# Patient Record
Sex: Female | Born: 1950
Health system: Southern US, Community
[De-identification: ages and names within clinical notes are randomized; demographics above are authoritative.]

## PROBLEM LIST (undated history)

## (undated) DIAGNOSIS — R51 Headache: Secondary | ICD-10-CM

## (undated) DIAGNOSIS — F419 Anxiety disorder, unspecified: Secondary | ICD-10-CM

## (undated) DIAGNOSIS — R0602 Shortness of breath: Secondary | ICD-10-CM

## (undated) HISTORY — PX: KNEE ARTHROPLASTY: SHX992

## (undated) HISTORY — PX: CARDIAC CATHETERIZATION: SHX172

## (undated) HISTORY — PX: JOINT REPLACEMENT: SHX530

## (undated) HISTORY — PX: ANKLE RECONSTRUCTION: SHX1151

---

## 1997-10-26 ENCOUNTER — Encounter: Admission: RE | Admit: 1997-10-26 | Discharge: 1997-10-26 | Payer: Self-pay | Admitting: *Deleted

## 1999-05-04 ENCOUNTER — Encounter: Admission: RE | Admit: 1999-05-04 | Discharge: 1999-05-04 | Payer: Self-pay | Admitting: Family Medicine

## 1999-05-04 ENCOUNTER — Encounter: Payer: Self-pay | Admitting: Family Medicine

## 1999-07-25 ENCOUNTER — Ambulatory Visit (HOSPITAL_COMMUNITY): Admission: RE | Admit: 1999-07-25 | Discharge: 1999-07-25 | Payer: Self-pay | Admitting: Family Medicine

## 1999-08-29 ENCOUNTER — Encounter: Payer: Self-pay | Admitting: Specialist

## 1999-08-29 ENCOUNTER — Encounter: Admission: RE | Admit: 1999-08-29 | Discharge: 1999-08-29 | Payer: Self-pay | Admitting: Specialist

## 1999-12-07 ENCOUNTER — Encounter: Admission: RE | Admit: 1999-12-07 | Discharge: 1999-12-07 | Payer: Self-pay | Admitting: Family Medicine

## 1999-12-08 ENCOUNTER — Encounter: Admission: RE | Admit: 1999-12-08 | Discharge: 1999-12-08 | Payer: Self-pay | Admitting: Family Medicine

## 1999-12-29 ENCOUNTER — Encounter: Admission: RE | Admit: 1999-12-29 | Discharge: 1999-12-29 | Payer: Self-pay | Admitting: Family Medicine

## 2000-12-07 ENCOUNTER — Encounter: Admission: RE | Admit: 2000-12-07 | Discharge: 2000-12-07 | Payer: Self-pay | Admitting: Family Medicine

## 2000-12-07 ENCOUNTER — Encounter: Payer: Self-pay | Admitting: Family Medicine

## 2003-05-09 ENCOUNTER — Emergency Department (HOSPITAL_COMMUNITY): Admission: EM | Admit: 2003-05-09 | Discharge: 2003-05-09 | Payer: Self-pay | Admitting: Emergency Medicine

## 2003-05-10 ENCOUNTER — Emergency Department (HOSPITAL_COMMUNITY): Admission: EM | Admit: 2003-05-10 | Discharge: 2003-05-10 | Payer: Self-pay | Admitting: Emergency Medicine

## 2003-05-11 ENCOUNTER — Emergency Department (HOSPITAL_COMMUNITY): Admission: EM | Admit: 2003-05-11 | Discharge: 2003-05-11 | Payer: Self-pay | Admitting: Emergency Medicine

## 2003-06-30 ENCOUNTER — Encounter: Admission: RE | Admit: 2003-06-30 | Discharge: 2003-06-30 | Payer: Self-pay | Admitting: Family Medicine

## 2003-11-19 ENCOUNTER — Ambulatory Visit (HOSPITAL_COMMUNITY): Admission: RE | Admit: 2003-11-19 | Discharge: 2003-11-20 | Payer: Self-pay | Admitting: Orthopaedic Surgery

## 2004-01-27 ENCOUNTER — Ambulatory Visit (HOSPITAL_COMMUNITY): Admission: RE | Admit: 2004-01-27 | Discharge: 2004-01-27 | Payer: Self-pay | Admitting: Orthopaedic Surgery

## 2004-06-07 ENCOUNTER — Inpatient Hospital Stay (HOSPITAL_COMMUNITY): Admission: RE | Admit: 2004-06-07 | Discharge: 2004-06-08 | Payer: Self-pay | Admitting: Orthopedic Surgery

## 2004-11-21 ENCOUNTER — Encounter (INDEPENDENT_AMBULATORY_CARE_PROVIDER_SITE_OTHER): Payer: Self-pay | Admitting: *Deleted

## 2004-11-21 ENCOUNTER — Ambulatory Visit (HOSPITAL_COMMUNITY): Admission: RE | Admit: 2004-11-21 | Discharge: 2004-11-21 | Payer: Self-pay | Admitting: Gastroenterology

## 2004-11-30 ENCOUNTER — Encounter: Admission: RE | Admit: 2004-11-30 | Discharge: 2004-11-30 | Payer: Self-pay | Admitting: Gastroenterology

## 2005-12-06 ENCOUNTER — Ambulatory Visit (HOSPITAL_COMMUNITY): Admission: RE | Admit: 2005-12-06 | Discharge: 2005-12-06 | Payer: Self-pay | Admitting: Gastroenterology

## 2007-02-28 ENCOUNTER — Encounter: Admission: RE | Admit: 2007-02-28 | Discharge: 2007-02-28 | Payer: Self-pay | Admitting: Family Medicine

## 2009-07-30 ENCOUNTER — Encounter: Admission: RE | Admit: 2009-07-30 | Discharge: 2009-07-30 | Payer: Self-pay | Admitting: Family Medicine

## 2010-02-01 ENCOUNTER — Encounter
Admission: RE | Admit: 2010-02-01 | Discharge: 2010-02-01 | Payer: Self-pay | Source: Home / Self Care | Attending: Cardiovascular Disease | Admitting: Cardiovascular Disease

## 2010-02-04 ENCOUNTER — Ambulatory Visit (HOSPITAL_COMMUNITY)
Admission: RE | Admit: 2010-02-04 | Discharge: 2010-02-04 | Payer: Self-pay | Source: Home / Self Care | Attending: Cardiovascular Disease | Admitting: Cardiovascular Disease

## 2010-03-10 ENCOUNTER — Other Ambulatory Visit (HOSPITAL_COMMUNITY): Payer: Self-pay | Admitting: Orthopedic Surgery

## 2010-03-10 DIAGNOSIS — M25561 Pain in right knee: Secondary | ICD-10-CM

## 2010-03-10 DIAGNOSIS — T84039A Mechanical loosening of unspecified internal prosthetic joint, initial encounter: Secondary | ICD-10-CM

## 2010-03-22 ENCOUNTER — Ambulatory Visit (HOSPITAL_COMMUNITY)
Admission: RE | Admit: 2010-03-22 | Discharge: 2010-03-22 | Disposition: A | Discharge status: Home / Self Care | Payer: Medicare Other | Source: Ambulatory Visit | Attending: Orthopedic Surgery | Admitting: Orthopedic Surgery

## 2010-03-22 ENCOUNTER — Other Ambulatory Visit (HOSPITAL_COMMUNITY): Payer: Self-pay

## 2010-03-22 DIAGNOSIS — M25561 Pain in right knee: Secondary | ICD-10-CM

## 2010-03-22 DIAGNOSIS — Z96659 Presence of unspecified artificial knee joint: Secondary | ICD-10-CM | POA: Insufficient documentation

## 2010-03-22 DIAGNOSIS — M25569 Pain in unspecified knee: Secondary | ICD-10-CM | POA: Insufficient documentation

## 2010-03-22 DIAGNOSIS — T84039A Mechanical loosening of unspecified internal prosthetic joint, initial encounter: Secondary | ICD-10-CM

## 2010-03-22 MED ORDER — TECHNETIUM TC 99M MEDRONATE IV KIT
25.0000 | PACK | Freq: Once | INTRAVENOUS | Status: AC | PRN
  Administered 2010-03-22: 25 via INTRAVENOUS
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
## 2010-06-10 NOTE — Op Note (Signed)
NAME:  Angela Burnett, Angela Burnett NO.:  [id number]   MEDICAL RECORD NO.:  [id number]          PATIENT TYPE:  INP   LOCATION:  0006                         FACILITY:  [facility]   PHYSICIAN:  Madlyn Frankel. Charlann Boxer, M.D.  DATE OF BIRTH:  08-Oct-1950   DATE OF PROCEDURE:  06/07/2004  DATE OF DISCHARGE:                                 OPERATIVE REPORT   PREOPERATIVE DIAGNOSES:  Right knee medial compartment osteoarthritis.   POSTOPERATIVE DIAGNOSES:  Right knee medial compartment osteoarthritis.   PROCEDURE:  Right unicompartmental knee replacement.   COMPONENTS USED:  Biomed Oxford unicompartmental knee with a size small  femur, size small 38 mm tibial tray and a size 4 small poly tray,  polyethylene liner.   SURGEON:  Madlyn Frankel. Charlann Boxer, M.D.   ASSISTANTDruscilla Brownie. Cherlynn June.   ANESTHESIA:  General.   ESTIMATED BLOOD LOSS:  100 mL after the tourniquet was let down at 100  minutes at 250 mmHg.   COMPLICATIONS:  None.   SPECIMENS:  None.   INDICATIONS FOR PROCEDURE:  Angela Burnett is a 60 year old female who I have  been following for some time for right knee medial compartment OA.  She ws  scheduled for the surgery a year ago but unfortunately had a spider bite  wound that took an extensive amount of time for it to resolve with  treatment. She now had recently presented with persistent right knee  discomfort following wound healing and was ready to proceed with surgery.  She does have a medical history consistent with depression and fibromyalgia  and a history of RSD. I reviewed with her the surgical options including  total versus partial knee replacement and from a recovery standpoint and  pain standpoint, I think a unicompartmental would best serve this patient.  Consent was obtained. The risks and benefits were reviewed including DVT,  bleeding, component failure and need to convert to total knee replacement in  the future. Questions were encouraged and answered and  reviewed in the note.   DESCRIPTION OF PROCEDURE:  The patient was brought to the operative theatre.  Once adequate anesthesia and preoperative antibiotics, 1 g of Ancef, were  administered, the patient was positioned supine on the operating room table,  proximal thigh tourniquet was placed. A Murray leg holder was then placed on  the right side of the bed and the leg then positioned into the leg holder  with the hip flexed at 30 degrees until allowing the knee to be free and  draped freed to flexion to at least 110 degrees. Once this was carried out,  the right lower extremity was then prepped and draped in a sterile fashion.  An approximately 3 inch incision was made on the medial parapatellar aspect  of the skin, sharp dissection was carried down the extensor mechanism.  Medial parapatellar arthrotomy was then carried out with the extension of  the incision into the VMO. Once exposure was obtained including removing  some the patellar fat pad, a portion of the medial meniscus was removed.  Landmarks were identified, osteophytes removed off the medial femoral  condyle as  well as off the intercondylar notch on the lateral aspect of the  medial femoral condyle in order to define the anatomy. The insertion of the  ACL was identified, ACL was noted to be intact. With this done, an  extramedullary guide was placed. Initial resection was made off the tibia  using the reciprocating saw along the medial aspect of the ACL insertion off  the tibial spine extended all the way posterior following the use of an  oscillating saw. Per the protocol for this system, I measured the tray to be  a size small or 38 mm and then positioned this in the knee. I was unable to  get the 4 mm feeler into the knee and for that reason, I backed the  resection guide down to the second holes removing an additional 3 mm of  bone. Following this, the 4 mm spacer was able to be placed without  difficulty. It moved with two  finger application of pressure.   Following this, attention was directed to the femur. The intramedullary  guide was positioned 1 cm above the anterior medial aspect of the  intercondylar notch and then passed down the femoral shaft. With the tibial  tray in place and the appropriate feeler guide placed, the feeler guide was  then positioned on the femur in the center of the bone and then parallel to  the intramedullary guide in all planes. All five requirements were  positioned. Once this was carried out, the femur was drilled. Drill holes  then positioned, the posterior femoral cut was made using an oscillating  saw, bone fragments removed. At this point, the remaining meniscus was  removed out of the posterior aspect of the knee under direct visualization.  Following this, we positioned the zero spigot and milled the distal femur.  Excess bone was removed. At this point, a trial reduction was carried out in  order to establish the flexion extension gaps. With the 4 mm feeler guide in  position and flexion this was stable. However, with the knee in 20 degrees  of extension, I was unable to even pass the 1 mm block and for that reason,  we passed the 4 mm spigot and then a size 4 spigot onto the femur. We milled  the proximal femur, removed excessive bone particularly in the posterior  medial and lateral aspects of the cut milling. Removed the very distal  aspect of the femoral area which was a small ridge. I then notched the  anterior aspect of the femur an additional 3-4 mm to allow for sliding  rotation of the poly.  At this point, trial reduction was carried out with a  4 mm spigot in place. The knee was very stable with equal extension and  flexion gaps. With this information, attention was directed to final  preparation of the tibia. The tibial tray was positioned with the hook posteriorly and confirmed that it was not positioned too far posterior and  had excellent position off the  medial aspect of the tibia. It was pinned in  position, a reciprocating saw was used to create the channel followed by  removal of excessive bone using the bone trial. Trial reduction again was  carried out with the keel tray, a size small femur and then the actual trial  4 poly. It tracked beautifully without impingement anteriorly. With the  handle in place, I was able to toggle approximately 2 mm in flexion  extension. The ligaments appeared stable, the knee  came to full extension.   With all of this information, final components were brought into the field.   The wound was then copiously irrigated with pulse lavage solution. Per the  [facility] protocol, the components were cemented in two stage technique, first  with the tibia taking the time to remove excessive cement once it had cured  particularly posteriorly followed by the femur each time, a trial component  was placed including the trial liner. With this, the trial 4 liner was  positioned, knee held in 45 degrees of flexion in order to apply pressure  across the joint surface. Once the final femoral component was cemented into  position, excessive cement removed, the final size 4 poly was then snapped  into position and tracked beautifully. Again the wound was copiously  irrigated with normal saline solution. At this point, a medium Hemovac drain  was placed exiting laterally. The extensor mechanism was then reapproximated  using a #1 PDS followed by two layer wound closure with 4-0 Monocryl on the  skin. The patient's wound was then cleaned, dried and dressed sterilely with  a bulky dressing. The patient was then extubated and transferred to the  recovery room in stable condition. She tolerated the procedure well with no  complications.   Will plan on Angela Burnett being able to get out of bed with therapy today to  ambulate and will plan on discharge tomorrow.      MDO/MEDQ  D:  06/07/2004  T:  06/07/2004  Job:  [id number]

## 2010-06-10 NOTE — Op Note (Signed)
NAME:  Angela Burnett, Angela Burnett            ACCOUNT NO.:  [id number]   MEDICAL RECORD NO.:  [id number]          PATIENT TYPE:  AMB   LOCATION:  ENDO                         FACILITY:  [facility]   PHYSICIAN:  Anselmo Rod, M.D.  DATE OF BIRTH:  12-11-1950   DATE OF PROCEDURE:  11/21/2004  DATE OF DISCHARGE:                                 OPERATIVE REPORT   PROCEDURE PERFORMED:  Esophagogastroduodenoscopy with antral biopsies.   ENDOSCOPIST:  Charna Elizabeth, M.D.   INSTRUMENT USED:  Olympus video panendoscope.   INDICATIONS FOR PROCEDURE:  The patient is a 60 year old white female with a  history of nausea, vomiting, with postprandial and epigastric pain, rule out  gastric outlet obstruction, etc.  The patient has also had a 19 pound weight  loss over the last couple of years.   PREPROCEDURE PREPARATION:  Informed consent was procured from the patient.  The patient was fasted for eight hours prior to the procedure.  The risks  and benefits were discussed with her in great detail.   PREPROCEDURE PHYSICAL:  The patient had stable vital signs.  Neck supple,  chest clear to auscultation.  S1, S2 regular.  Abdomen soft with normal  bowel sounds.   DESCRIPTION OF PROCEDURE:  The patient was placed in the left lateral  decubitus position and sedated with 60 mg of Demerol and 8 mg of Versed in  slow incremental doses.  Once the patient was adequately sedated and  maintained on low-flow oxygen and continuous cardiac monitoring, the Olympus  video panendoscope was advanced through the mouth piece over the tongue into  the esophagus under direct vision.  The entire esophagus appeared normal  with no evidence of ring, stricture, masses, esophagitis or Barrett's  mucosa.  The scope was then advanced to the stomach.  Retroflexion was not  possible as the patient was very combative.  A prominent fold was seen in  the antrum with diffuse gastritis and antral erosions.  Biopsies were done  times two to rule  out presence of Helicobacter pylori by pathology.  The  proximal small bowel appeared normal.  There was no outlet obstruction.  The  patient tolerated the procedure well without immediate complications.   IMPRESSION:  1.  Normal-appearing esophagus.  2.  Diffuse gastritis with antral erosions and a prominent fold in the      antrum, biopsies done times two.  3.  Normal proximal small bowel.   RECOMMENDATIONS:  1.  Await pathology results.  2.  Avoid nonsteroidals.  3.  Proton pump inhibitor of choice.  4.  Proceed with a flexible sigmoidoscopy to see if distal colon evaluation      can be done as the patient is refusing to come back for colonoscopy.      She could not tolerate her prep and therefore was advised by the      physician on call to have an esophagogastroduodenoscopy today and have      the colonoscopy at a later date.      Anselmo Rod, M.D.  Electronically Signed     JNM/MEDQ  D:  11/21/2004  T:  11/21/2004  Job:  [id number]   cc:   Teena Irani. Arlyce Dice, M.D.  Fax: [phone]

## 2010-06-10 NOTE — Op Note (Signed)
NAME:  Angela Burnett, Angela Burnett NO.:  [id number]   MEDICAL RECORD NO.:  [id number]          PATIENT TYPE:  OIB   LOCATION:  2899                         FACILITY:  [facility]   PHYSICIAN:  Sharolyn Douglas, M.D.        DATE OF BIRTH:  10-02-1950   DATE OF PROCEDURE:  01/27/2004  DATE OF DISCHARGE:                                 OPERATIVE REPORT   PREOPERATIVE DIAGNOSIS:  Malfunctioning spinal cord stimulator.   POSTOPERATIVE DIAGNOSIS:  Malfunctioning spinal cord stimulator.   OPERATION PERFORMED:  1.  Revision thoracic laminotomy, left, for removal of permanent spinal cord      stimulator lead.  2.  Removal of IPG from right buttock.   SURGEON:  Sharolyn Douglas, M.D.   ASSISTANT:  Verlin Fester, P.A.   ANESTHESIA:  General endotracheal.   COMPLICATIONS:  None.   INDICATIONS FOR PROCEDURE:  The patient is a pleasant 60 year old female,  who is status post placement of a permanent spinal cord stimulator and IPG  for reflex sympathetic dystrophy of the left lower extremity.  Unfortunately, she has not utilized the stimulator.  She feels like it  causes pain and would like to have it removed. The risks, benefits and  alternatives were reviewed and she elected to proceed.   DESCRIPTION OF PROCEDURE:  The patient was properly identified in the  holding area and taken to the operating room where she underwent general  endotracheal anesthesia without difficulty and given prophylactic  antibiotics.  Carefully positioned prone on the Wilson frame.  All bony  prominences were padded.  Face and eyes protected at all times.  Back  prepped and draped in the usual sterile fashion.  The previous midline  thoracic incision was opened.  Dissection was carried through the deep  fascia.  We immediately identified the stimulator lead.  Using careful  dissection, the previous laminotomy was identified.  Kerrison punch was used  to expand the granulation tissue around the stimulator wire.  We then  could  easily remove the stimulator from the epidural space.  We then turned our  attention to the IPG in the right buttock.  The previous incision was  opened.  The IPG was identified and easily removed.  The wounds were  irrigated and then closed in layers using Vicryl suture.  Benzoin and Steri-  Strips were placed.  Sterile dressing applied.  The patient was turned  supine and extubated without difficulty and transferred to recovery in  stable condition.    MC/MEDQ  D:  01/27/2004  T:  01/27/2004  Job:  [id number]

## 2010-06-10 NOTE — Op Note (Signed)
NAME:  Angela Burnett, Angela Burnett            ACCOUNT NO.:  [id number]   MEDICAL RECORD NO.:  [id number]          PATIENT TYPE:  AMB   LOCATION:  ENDO                         FACILITY:  [facility]   PHYSICIAN:  Anselmo Rod, M.D.  DATE OF BIRTH:  1950/04/21   DATE OF PROCEDURE:  11/21/2004  DATE OF DISCHARGE:                                 OPERATIVE REPORT   PROCEDURE PERFORMED:  Flexible sigmoidoscopy.   INSTRUMENT USED:  Olympus video panendoscope.   INDICATIONS FOR PROCEDURE:  The patient is a 60 year old white female with a  history of abnormal weight loss, unable to tolerate her prep, undergoing a  flexible sigmoidoscopy to see if we could partially evaluate her left colon  as she refuses to come back for colonoscopy at a later date.   PREPROCEDURE PREPARATION:  Informed consent was procured from the patient.  The patient was fasted for eight hours prior to the procedure.   PREPROCEDURE PHYSICAL:  The patient had stable vital signs.  Neck supple.  Chest clear to auscultation.  S1 and S2 regular.  Abdomen soft with normal  bowel sounds.   DESCRIPTION OF PROCEDURE:  The patient was placed in left lateral decubitus  position.  No additional sedation was used.  Once the patient was adequately  positioned, the Olympus video panendoscope was advanced from the rectum to  10 cm.  There was some hard stool throughout this area and the procedure was  aborted with plans to do a possible virtual colonoscopy at a later date if  the patient agrees, as she refused to come back here for a colonoscopy.  The  patient was not charged for this procedure.   IMPRESSION:  A large amount of solid stool in the rectum.  Procedure  aborted.   RECOMMENDATIONS:  Outpatient follow-up in the next two weeks for further  recommendation.  Abdominal ultrasound and HIDA scan have also been scheduled  for the patient on November 30, 2004.  Further recommendations will be made  thereafter.      Anselmo Rod,  M.D.  Electronically Signed     JNM/MEDQ  D:  11/21/2004  T:  11/21/2004  Job:  [id number]   cc:   Teena Irani. Arlyce Dice, M.D.  Fax: [phone]

## 2010-06-10 NOTE — H&P (Signed)
NAME:  Angela Burnett, BASISTA NO.:  [id number]   MEDICAL RECORD NO.:  [id number]          PATIENT TYPE:  OIB   LOCATION:  NA                           FACILITY:  [facility]   PHYSICIAN:  Sharolyn Douglas, M.D.        DATE OF BIRTH:  Jun 03, 1950   DATE OF ADMISSION:  11/19/2003  DATE OF DISCHARGE:                                HISTORY & PHYSICAL   CHIEF COMPLAINT:  Constant pain in my left leg.   HISTORY OF PRESENT ILLNESS:  This 60 year old white female who has an  extensive history concerning reflex sympathetic dystrophy in the left lower  extremity.  She had a crush-type injury to her ankle some years ago and has  been left with this dystrophic pattern.  Most recently to complicate  matters, the patient had a spider bite in the left lower extremity and was  treated with antibiotics.  She was on sulfamethizole double strength for the  spider bite.  We feel now that it has cleared enough that she can go ahead  with an implanted spinal cord stimulator.  She has done well with the spinal  cord stimulator trial.  The complications and risks as well as benefits of  surgery have been discussed with the patient.  She has elected to go ahead  with the surgery.   PAST MEDICAL HISTORY:  Her physicians are Teena Irani. Arlyce Dice, M.D. of  [facility].   ALLERGIES:  No known drug allergies.   CURRENT MEDICATIONS:  1.  Gabitril 4 mg.  2.  Clonidine 0.1 mg daily.  3.  Estroven.  4.  Calcium.  5.  Biofreeze Pain Gel.   She has a history of anxiety and depression and has used Zoloft.  She has  temperature intolerances and uses Estroven.   PAST SURGICAL HISTORY:  Left ankle reconstruction in 1981.  In January of  1986 the pins were removed from her ankle.  She had a fusion in 1990 with a  redo in 1991.  Screws from the fusion were removed in 2001.  She has had  multiple sympathetic nerve blocks for over a year from 2002 to 2003 by  Gerlene Burdock D. Ethelene Hal, M.D.  She  has had multiple therapy modalities.   FAMILY HISTORY:  Positive for heart disease in her mother who is deceased  and diabetes.  Her mother had ovarian cancer.  Father deceased with  rheumatoid arthritis and Ryter's disease.   SOCIAL HISTORY:  The patient is married, she does not work.  She smokes 3/4  to 1 pack of cigarettes per day and has a rare intake of EtOH.  One child.  Her husband will be caregiver after surgery.  There are 13 stairs in her two-  story home.   REVIEW OF SYSTEMS:  CNS:  No seizure disorder or paralysis, or double  vision.  The patient does have significant RSD in the left lower extremity.  CARDIOVASCULAR:  No chest pain, no angina, and no orthopnea.  RESPIRATORY:  No productive cough, no hemoptysis, and no shortness of breath.  GASTROINTESTINAL:  No nausea,  vomiting, melena, or bloody stools.  She does  have some post anesthesia nausea.  GENITOURINARY:  No discharge, dysuria, or  hematuria.  MUSCULOSKELETAL:  In present illness.   PHYSICAL EXAMINATION:  GENERAL:  Alert, cooperative, and friendly.  Fully  alert 60 year old white female who walks with a slight limp to the left.  VITAL SIGNS:  Blood pressure 112/68 seated, pulse 80 and regular,  respirations 12 and unlabored.  HEENT:  Normocephalic, PERRLA.  EOM intact.  Oropharynx is clear.  CHEST:  Clear to auscultation.  No rhonchi, no rales, and no wheezes.  HEART:  Regular rate and rhythm with no murmurs heard.  ABDOMEN:  Soft and nontender.  Liver and spleen not felt.  GENITOURINARY:  RECTAL:  PELVIC:  BREASTS:  Not done, not pertinent to  present illness.  EXTREMITIES:  She has nearly healed ulcer on her lower leg.  No signs of  drainage.  Examination consistent with RSD in the left lower extremity.   ADMISSION DIAGNOSES:  1.  Reflex sympathetic dystrophy in left lower extremity.  2.  History of anxiety and depression.   PLAN:  The patient will undergo implant of spinal cord stimulator in the  right  superior hip area posteriorly.       DLU/MEDQ  D:  11/13/2003  T:  11/13/2003  Job:  [id number]   cc:   Teena Irani. Arlyce Dice, M.D.  [address]  [place]  [place] [address]  Fax: [phone]

## 2010-06-10 NOTE — Op Note (Signed)
NAME:  Angela Burnett, Angela Burnett NO.:  [id number]   MEDICAL RECORD NO.:  [id number]          PATIENT TYPE:  OIB   LOCATION:  2550                         FACILITY:  [facility]   PHYSICIAN:  Sharolyn Douglas, M.D.        DATE OF BIRTH:  July 14, 1950   DATE OF PROCEDURE:  11/19/2003  DATE OF DISCHARGE:                                 OPERATIVE REPORT   DIAGNOSIS:  Reflex sympathetic dystrophy of left lower extremity.   PROCEDURES:  1.  T12-L1 laminotomy, left side, for placement of spinal cord stimulator      lead.  2.  Placement of IPG, right buttock.  3.  Fluoroscopic guidance utilized for placement of spinal cord stimulator      lead.   SURGEON:  Sharolyn Douglas, M.D.   ASSISTANT:  Verlin Fester, P.A.   ANESTHESIA:  MAC plus local.   COMPLICATIONS:  None.   INDICATIONS:  The patient is a very pleasant 60 year old female with chronic  left leg pain secondary to RSD, status post multiple left ankle surgeries.  She has been refractory to all other treatment modalities.  She elected to  undergo a permanent spinal cord stimulator in hopes of improving her  symptoms.  She had a successful trial stimulator lead placed percutaneously.  Risks and benefits extensively reviewed.   DESCRIPTION OF PROCEDURE:  The patient was properly identified in the  holding area and taken the operating room.  She was carefully positioned on  the operating room table using pillows.  She was given conscious sedation by  the anesthesia department.  Fluoroscopy was brought into the field after the  back was prepped and draped in usual sterile fashion.  The T12-L1 interspace  was identified.  A small midline incision was made.  The deep fascia was  incised and the paraspinal muscles elevated out on the left side over the  T12-L1 interspace.  A small laminotomy was created.  A Lamitrode S4 lead,  model 3246, was carefully passed into the spinal canal under fluoroscopic  guidance.  The patient's sedation was then  lightened and the stimulator lead  was trialed intraoperatively.  The patient reported that she had good  coverage in the left lower extremity down into her ankle.  We then turned  our attention to anchoring the stimulator lead to the interspinous ligament  using the included anchor and a 3-0 Prolene suture.  We then made a second  incision over the right buttock in a transverse fashion.  Dissection was  carried just above the muscle layer.  We then created a subcutaneous pocket.  The tunneling tool was used to tunnel between the midline incision and the  right buttock incision.  The stimulator wire was passed into the right  buttock incision.  We then placed the IPG, model 3716, and attached the  lead.  The stimulator was then tested intraoperatively and found to be  functioning.  A relief was left both proximally and distally.  The wounds  were irrigated.  The deep fascia was closed with an 0 Vicryl suture.  The  deep fascia was closed with  an 0 Vicryl suture.  The subcutaneous layer was closed with 2-0 Vicryl following by a running  subcuticular 0 Vicryl suture on the skin edge.  Benzoin and Steri-Strips  placed.  Sterile dressing applied.  The patient was turned supine and  transferred to the recovery room in stable condition, able to move her upper  and lower extremities.       MC/MEDQ  D:  11/19/2003  T:  11/19/2003  Job:  [id number]

## 2010-06-28 ENCOUNTER — Other Ambulatory Visit: Payer: Self-pay | Admitting: Orthopedic Surgery

## 2010-06-28 ENCOUNTER — Encounter (HOSPITAL_COMMUNITY): Payer: Medicare Other

## 2010-06-28 LAB — CBC
HCT: 38.2 % (ref 36.0–46.0)
Hemoglobin: 12.9 g/dL (ref 12.0–15.0)
MCH: 30.7 pg (ref 26.0–34.0)
MCHC: 33.8 g/dL (ref 30.0–36.0)
MCV: 91 fL (ref 78.0–100.0)
Platelets: 125 10*3/uL — ABNORMAL LOW (ref 150–400)
RBC: 4.2 MIL/uL (ref 3.87–5.11)
RDW: 12.3 % (ref 11.5–15.5)
WBC: 3.8 10*3/uL — ABNORMAL LOW (ref 4.0–10.5)

## 2010-06-28 LAB — URINALYSIS, ROUTINE W REFLEX MICROSCOPIC
Bilirubin Urine: NEGATIVE
Glucose, UA: NEGATIVE mg/dL
Hgb urine dipstick: NEGATIVE
Ketones, ur: NEGATIVE mg/dL
Nitrite: NEGATIVE
Protein, ur: NEGATIVE mg/dL
Specific Gravity, Urine: 1.018 (ref 1.005–1.030)
Urobilinogen, UA: 0.2 mg/dL (ref 0.0–1.0)
pH: 7.5 (ref 5.0–8.0)

## 2010-06-28 LAB — BASIC METABOLIC PANEL
BUN: 13 mg/dL (ref 6–23)
CO2: 30 mEq/L (ref 19–32)
Calcium: 9.1 mg/dL (ref 8.4–10.5)
Chloride: 104 mEq/L (ref 96–112)
Creatinine, Ser: 0.71 mg/dL (ref 0.4–1.2)
GFR calc Af Amer: >60 mL/min (ref >60)
GFR calc non Af Amer: >60 mL/min (ref >60)
Glucose, Bld: 86 mg/dL (ref 70–99)
Potassium: 4.5 mEq/L (ref 3.5–5.1)
Sodium: 141 mEq/L (ref 135–145)

## 2010-06-28 LAB — SURGICAL PCR SCREEN
MRSA, PCR: NEGATIVE
Staphylococcus aureus: NEGATIVE

## 2010-06-28 LAB — DIFFERENTIAL
Basophils Absolute: 0 10*3/uL (ref 0.0–0.1)
Basophils Relative: 0 % (ref 0–1)
Eosinophils Absolute: 0.1 10*3/uL (ref 0.0–0.7)
Eosinophils Relative: 2 % (ref 0–5)
Lymphocytes Relative: 32 % (ref 12–46)
Lymphs Abs: 1.2 10*3/uL (ref 0.7–4.0)
Monocytes Absolute: 0.3 10*3/uL (ref 0.1–1.0)
Monocytes Relative: 8 % (ref 3–12)
Neutro Abs: 2.2 10*3/uL (ref 1.7–7.7)
Neutrophils Relative %: 57 % (ref 43–77)

## 2010-06-28 LAB — PROTIME-INR
INR: 0.99 (ref 0.00–1.49)
Prothrombin Time: 13.3 seconds (ref 11.6–15.2)

## 2010-06-28 LAB — APTT: aPTT: 26 seconds (ref 24–37)

## 2010-07-05 ENCOUNTER — Inpatient Hospital Stay (HOSPITAL_COMMUNITY)
Admission: RE | Admit: 2010-07-05 | Discharge: 2010-07-07 | DRG: 468 | Disposition: A | Discharge status: Home Health | Payer: Medicare Other | Source: Ambulatory Visit | Attending: Orthopedic Surgery | Admitting: Orthopedic Surgery

## 2010-07-05 DIAGNOSIS — Z01812 Encounter for preprocedural laboratory examination: Secondary | ICD-10-CM

## 2010-07-05 DIAGNOSIS — Y831 Surgical operation with implant of artificial internal device as the cause of abnormal reaction of the patient, or of later complication, without mention of misadventure at the time of the procedure: Secondary | ICD-10-CM | POA: Diagnosis present

## 2010-07-05 DIAGNOSIS — Z96659 Presence of unspecified artificial knee joint: Secondary | ICD-10-CM

## 2010-07-05 DIAGNOSIS — M1710 Unilateral primary osteoarthritis, unspecified knee: Secondary | ICD-10-CM | POA: Diagnosis present

## 2010-07-05 DIAGNOSIS — T84099A Other mechanical complication of unspecified internal joint prosthesis, initial encounter: Principal | ICD-10-CM | POA: Diagnosis present

## 2010-07-05 DIAGNOSIS — D649 Anemia, unspecified: Secondary | ICD-10-CM | POA: Diagnosis not present

## 2010-07-05 LAB — CBC
HCT: 36.1 % (ref 36.0–46.0)
Hemoglobin: 12.4 g/dL (ref 12.0–15.0)
MCH: 30.9 pg (ref 26.0–34.0)
MCHC: 34.3 g/dL (ref 30.0–36.0)
MCV: 90 fL (ref 78.0–100.0)
Platelets: ADEQUATE 10*3/uL (ref 150–400)
RBC: 4.01 MIL/uL (ref 3.87–5.11)
RDW: 12.3 % (ref 11.5–15.5)
WBC: 3.3 10*3/uL — ABNORMAL LOW (ref 4.0–10.5)

## 2010-07-05 LAB — DIFFERENTIAL
Basophils Absolute: 0 10*3/uL (ref 0.0–0.1)
Basophils Relative: 1 % (ref 0–1)
Eosinophils Absolute: 0.1 10*3/uL (ref 0.0–0.7)
Eosinophils Relative: 3 % (ref 0–5)
Lymphocytes Relative: 35 % (ref 12–46)
Lymphs Abs: 1.1 10*3/uL (ref 0.7–4.0)
Monocytes Absolute: 0.2 10*3/uL (ref 0.1–1.0)
Monocytes Relative: 6 % (ref 3–12)
Neutro Abs: 1.8 10*3/uL (ref 1.7–7.7)
Neutrophils Relative %: 56 % (ref 43–77)

## 2010-07-05 LAB — GRAM STAIN

## 2010-07-05 LAB — TYPE AND SCREEN
ABO/RH(D): B POS
Antibody Screen: NEGATIVE

## 2010-07-05 LAB — ABO/RH: ABO/RH(D): B POS

## 2010-07-05 NOTE — H&P (Signed)
NAME:  Angela Burnett, Angela Burnett NO.:  [id number]  MEDICAL RECORD NO.:  [id number]  LOCATION:                                 FACILITY:  PHYSICIAN:  Madlyn Frankel. Charlann Boxer, M.D.  DATE OF BIRTH:  [birth year]  DATE OF ADMISSION: DATE OF DISCHARGE:                             HISTORY & PHYSICAL   ADMISSION DIAGNOSIS:  Failed right knee hemiarthroplasty.  HISTORY OF PRESENT ILLNESS:  This is a 60 year old lady with a history of hemiarthroplasty with loosening of her right knee.  Due to continued problems with the knee, she is now scheduled for revision to a total knee arthroplasty.  The surgery risks, benefits, and aftercare were discussed with the patient.  Questions invited and answered.  Note that the patient's platelets were low at 117 at her preop with her medical doctor.  We will get a repeat of that the day of surgery to make sure they are staying stable.  Also note that the patient is a candidate for tranexamic acid and will receive that preop.  DRUG ALLERGIES:  None.  CURRENT MEDICATIONS: 1. Avinza 60 mg 1 daily. 2. Oxycodone 10/325 one q.4-6 h p.r.n. pain. 3. Folic acid. 4. Fiber-Tabs. 5. Caltrate. 6. Aspirin 81 mg.  SERIOUS MEDICAL ILLNESSES:  Chronic pain.  PREVIOUS SURGERIES:  Multiple surgeries to left ankle with fusion and repeat fusion.  Chronic low back pain with selective nerve root blocks, pain blocks, and spinal cord stimulator with subsequent removal.  Right knee partial replacement, hammertoe correction, and a history of cardiac cath which was normal by the patient's report.  FAMILY HISTORY:  Positive for heart attack and high blood pressure.  SOCIAL HISTORY:  The patient is married.  She does not smoke and does not drink.  REVIEW OF SYSTEMS:  CENTRAL NERVOUS SYSTEM:  Positive for poor memory and night sweats.  CARDIOVASCULAR:  Positive for history of chest pain, worked up with cardiac cath which was normal by patient's report. PULMONARY:   Negative for shortness of breath, PND, or orthopnea.  GI: Positive for constipation.  GU:  Positive for frequent night urination. MUSCULOSKELETAL:  Positive as in HPI.  PHYSICAL EXAMINATION:  VITAL SIGNS:  BP 110/70, pulse 72 and regular, and respirations 12. GENERAL APPEARANCE:  This is a well-developed, well-nourished lady in no acute distress. HEENT:  Head is normocephalic.  Nose patent.  Ears patent.  Pupils equal, round and reactive to light.  Throat without injection. NECK:  Supple without adenopathy.  Carotids are 2+ without bruit. CHEST:  Clear to auscultation.  No rales or rhonchi.  Respirations are 12. HEART:  Regular rate and rhythm at 72 beats without murmur. ABDOMEN:  Soft with active bowel sounds.  No masses or organomegaly. NEUROLOGIC:  The patient is alert and oriented to time, place, and person.  Cranial nerves II through XII grossly intact. EXTREMITIES:  Left ankle status post multiple surgeries and fusions with well-healed scar.  The right knee shows well-healed scar from hemiarthroplasty, 0-125 degrees range of motion.  Neurovascular status intact.  IMPRESSION:  Right knee osteoarthritis with previous hemiarthroplasty with loosening.  PLAN:  Revision of hemiarthroplasty to total knee arthroplasty.     Jaquelyn Bitter. Chabon, P.A.  ______________________________ Madlyn Frankel Charlann Boxer, M.D.    SJC/MEDQ  D:  06/29/2010  T:  06/30/2010  Job:  [id number]  Electronically Signed by Jodene Nam P.A. on 07/05/2010 04:21:55 PM Electronically Signed by Durene Romans M.D. on 07/05/2010 04:27:56 PM

## 2010-07-05 NOTE — Op Note (Signed)
NAME:  Angela Burnett, Angela Burnett NO.:  [id number]  MEDICAL RECORD NO.:  [id number]  LOCATION:  0004                         FACILITY:  [facility]  PHYSICIAN:  Madlyn Frankel. Charlann Boxer, M.D.  DATE OF BIRTH:  [birth year]  DATE OF PROCEDURE:  07/05/2010 DATE OF DISCHARGE:                              OPERATIVE REPORT   PREOPERATIVE DIAGNOSIS:  Failed right partial knee replacement.  POSTOPERATIVE DIAGNOSIS:  Failed right partial knee replacement.  FINDINGS:  The patient's synovial fluid was noted to be clear.  There was no sign of purulence.  Slight bit of inflammatory viscosity to it but it was completely clear and yellow.  PROCEDURE:  Revision right total knee replacement utilizing DePuy components.  I used a size 2.5 primary femoral component and size 2.5 primary tibial component with a 12.5 insert and 35 patellar button, 12.5 insert was utilized.  SURGEON:  Madlyn Frankel. Charlann Boxer, M.D.  ASSISTANT:  Jaquelyn Bitter. Chabon, P.A.  ANESTHESIA:  Initial spinal block but converted to general LMA with some increased pain.  SPECIMEN:  I did send joint fluid for stat Gram stain and culture. Again joint fluid was noted be clear.  COMPLICATIONS:  None.  DRAINS:  Hemovac.  TOURNIQUET TIME:  38 minutes at 250 mmHg.  INDICATIONS FOR PROCEDURE:  Angela Burnett is a 60 year old female.  She is now 6 years out from a right partial knee replacement.  She had been doing well until she recently presented with increasing pain.  On her clinical exam, she had no swelling on her knee, no warmth.  She had pain.  Bone scan revealed concerns for loosening though radiographic evidence did not support this.  Based on her recent onset of pain and workup, she wished at this point to proceed with more definitive measures.  Risks and benefits were discussed potentially the concern for infection raised by bone scan were all reviewed and outlined.  Consent was obtained for the above.  PROCEDURE IN DETAIL:  The  patient was brought to the operative theater. Once adequate anesthesia, preoperative antibiotics, Ancef administered, she was positioned supine with the right thigh tourniquet placed.  The right lower extremity was then prepped and draped in sterile fashion.  A time-out was performed identifying the patient, planned procedure and extremity.  The right leg was positioned into the [facility] leg holder.  The leg was exsanguinated, tourniquet elevated to 250 mmHg.  The patient's old incision was incised and extended proximally and distally to allow for exposure for primary knee and arthrotomy was made encountering clear synovial fluid.  Specimen was sent however.  The soft tissues of the knee appeared to be completely healthy with no evidence of any infection.  Following initial exposure, attention was first directed to patella. Precut measurement was approximately 23 mm.  I resected down to about 14 mm and used a 35 patellar button to restore height.  Lug holes were drilled and a metal shim placed to protect patella from retracting saw blades.  At this point attention was directed to femur. Femoral canal was opened with a drill, irrigated try to prevent fat emboli.  Using primary instrumentation, the intramedullary rod was positioned at 3 degrees of valgus, resected 10 mm  of bone off the distal femur.  This allowed for resection of the distal medial femur at the level of the previous bone preparation without significant loss of bone, no need for any augments.  The tibia was then subluxated anteriorly using extramedullary guide. After remaining meniscus, I resected using a measured resection of 10 mm at the proximal lateral tibia.  This was basically at level of the previous bone preparation so thus no augments removed.  Once the bone cut was made, I found there was going to be no need for augmenting and I was going to be removing some of the cement from this area.  I confirmed the gap  going to be stable with at least  10 mm insert with stable medial and lateral collateral ligaments.  We also confirmed the cut was perpendicular in the coronal plane.  At this point, I sized the femur to be a size 2.5.  The size 2.5 rotation block was then pinned into position, anterior referenced using the C clamp to set rotation. The 4-in-1 cutting block was then pinned into position.  Anterior- posterior chamfer cuts were then made without difficulty, nor notching.  Following this, the final box was made off the lateral aspect distal femur.  No defects were noted that required any augmentation of the femur.  Final preparation of tibia was now carried out using a 2.5 tibial tray to fit the cut surface best.  Again the medial defect was only where the keel was, there was no need for any augments.  I felt that the remaining bone stock provided the structures support needed and could be filled with the cement to the gaps.  Final keel preparation of tibia was carried out.  Trial reduction was carried out, noting that 12.5 insert allowed the knee to come to full extension with stable medial and lateral collateral ligaments.  Patella tracked through the trochlea without application of pressure.  At this point all trial components were removed.  The knee was copiously irrigated with normal saline solution and the synovial capsule junction injected with 0.25% Marcaine with epinephrine and 1 cc of Toradol, total 31 cc.  Final components were opened and loose cement was mixed.  Final components were cemented onto clean and dried cut surfaces of bone.  The knee was brought to extension with 12.5 insert.  Extruded cement removed.  Once cement had fully cured and excessive cement was removed throughout the knee, final 12.5 insert was chosen.  The tourniquet had been let down after 38 minutes.  No significant hemostasis required. Medium Hemovac drain was placed deep.  The knee was reirrigated  with normal saline solution and the extensor mechanism was then reapproximated using #1 Vicryl with the knee in flexion and remainder of the wound was closed with 2-0 Vicryl and running 4-0 Monocryl.  The knee was cleaned, dried, and dressed sterilely using Dermabond and Aquacel dressing, drain site dressed separately.  She was then brought to recovery room extubated in stable condition, tolerating the procedure well.     Madlyn Frankel Charlann Boxer, M.D.     MDO/MEDQ  D:  07/05/2010  T:  07/05/2010  Job:  [id number]  Electronically Signed by Durene Romans M.D. on 07/05/2010 04:28:03 PM

## 2010-07-06 LAB — CBC
HCT: 28.6 % — ABNORMAL LOW (ref 36.0–46.0)
Hemoglobin: 9.7 g/dL — ABNORMAL LOW (ref 12.0–15.0)
MCH: 31.1 pg (ref 26.0–34.0)
MCHC: 33.9 g/dL (ref 30.0–36.0)
MCV: 91.7 fL (ref 78.0–100.0)
Platelets: 94 10*3/uL — ABNORMAL LOW (ref 150–400)
RBC: 3.12 MIL/uL — ABNORMAL LOW (ref 3.87–5.11)
RDW: 12.6 % (ref 11.5–15.5)
WBC: 3.6 10*3/uL — ABNORMAL LOW (ref 4.0–10.5)

## 2010-07-06 LAB — BASIC METABOLIC PANEL
BUN: 10 mg/dL (ref 6–23)
CO2: 29 mEq/L (ref 19–32)
Calcium: 8.6 mg/dL (ref 8.4–10.5)
Chloride: 107 mEq/L (ref 96–112)
Creatinine, Ser: 0.54 mg/dL (ref 0.4–1.2)
GFR calc Af Amer: >60 mL/min (ref >60)
GFR calc non Af Amer: >60 mL/min (ref >60)
Glucose, Bld: 111 mg/dL — ABNORMAL HIGH (ref 70–99)
Potassium: 4.2 mEq/L (ref 3.5–5.1)
Sodium: 142 mEq/L (ref 135–145)

## 2010-07-07 LAB — CBC
MCH: 31.6 pg (ref 26.0–34.0)
Platelets: 91 10*3/uL — ABNORMAL LOW (ref 150–400)
RBC: 3.01 MIL/uL — ABNORMAL LOW (ref 3.87–5.11)
RDW: 12.2 % (ref 11.5–15.5)
WBC: 5.3 10*3/uL (ref 4.0–10.5)

## 2010-07-07 LAB — BASIC METABOLIC PANEL
CO2: 27 mEq/L (ref 19–32)
Calcium: 9 mg/dL (ref 8.4–10.5)
Creatinine, Ser: <0.47 mg/dL (ref 0.4–1.2)
Sodium: 137 mEq/L (ref 135–145)

## 2010-07-08 LAB — BODY FLUID CULTURE: Culture: NO GROWTH

## 2010-07-10 LAB — ANAEROBIC CULTURE

## 2010-07-12 NOTE — Discharge Summary (Signed)
  NAME:  Angela Burnett, Angela Burnett NO.:  [id number]  MEDICAL RECORD NO.:  [id number]  LOCATION:  1604                         FACILITY:  [facility]  PHYSICIAN:  Madlyn Frankel. Charlann Boxer, M.D.  DATE OF BIRTH:  11/08/1950  DATE OF ADMISSION:  07/05/2010 DATE OF DISCHARGE:  07/07/2010                              DISCHARGE SUMMARY   ADMITTING DIAGNOSIS:  Failed partial knee replacement, right.  DISCHARGE DIAGNOSIS:  Failed partial knee replacement, right.  OPERATION:  Revision partial knee replacement to total knee arthroplasty.  BRIEF HISTORY:  This is a 60 year old lady with a history of a failed partial knee replacement of the right knee who is in at this time for conversion to total knee arthroplasty.  The surgery risk, benefits and aftercare were discussed with the patient.  Questions were invited and answered.  LABORATORY VALUES:  Admission CBC was within normal limits with the exception of platelets slightly low at 125, WBC slightly low at 3.8. Admission PT and PTT within normal limits.  Urinalysis within normal limits.  Hemoglobin and hematocrit reached a low 95 and 27.1 on July 07, 2010, with platelets 91.  BMET within normal limits except glucose high at 125.  Cultures showed no growth at discharge.  Gram stain showed no organisms and rare wbc.  COURSE IN THE HOSPITAL:  The patient tolerated the operative procedure well.  First postoperative day, she had expected pain, vital signs were stable, afebrile.  Neurovascular status intact.  She was started on Xarelto as a postoperative DVT prophylaxis in the hospital and will be switched to enteric-coated aspirin at discharge.  She was started on physical therapy.  Second postoperative day, vital signs were stable. She was afebrile.  Hemoglobin 9.5, hematocrit 27.1, platelets 91.  BMET within normal limits with the exception of glucose at 125.  Lungs clear. Heart sounds normal.  Bowel sounds active.  Dressing was dry.   Calves were negative and she is planned for discharge home after physical therapy today.  CONDITION ON DISCHARGE:  Improved.  DISCHARGE MEDICATIONS: 1. Avinza 60 mg one daily. 2. Oxycodone 5/325 one to two q.4-6 h. p.r.n. pain for 7 days, then     resume Dr. Ethelene Hal usually does of 10 mg b.i.d. 3. Aspirin 325 mg one p.o. b.i.d. for 4 weeks, enteric-coated for DVT     prophylaxis. 4. Robaxin 500 one q.6 h. p.r.n. spasm. 5. MiraLax and Colace over-the-counter p.r.n. constipation. 6. Iron 325 one p.o. t.i.d. for 2 weeks.  DISCHARGE INSTRUCTIONS:  She is instructed to keep the wound clean and dry for 3 weeks.  She will keep the wound clean and dry for the 3 weeks. She will do her home physical therapy with Genevieve Norlander and return to the office for follow up in 10 days.  She will call sooner p.r.n. any problems.     Jaquelyn Bitter. Chabon, P.A.   ______________________________ Madlyn Frankel Charlann Boxer, M.D.    SJC/MEDQ  D:  07/07/2010  T:  07/07/2010  Job:  [id number]  Electronically Signed by Jodene Nam P.A. on 07/11/2010 02:54:25 PM Electronically Signed by Durene Romans M.D. on 07/12/2010 09:10:01 AM

## 2012-02-13 ENCOUNTER — Other Ambulatory Visit (HOSPITAL_COMMUNITY): Payer: Self-pay | Admitting: Orthopedic Surgery

## 2012-02-13 DIAGNOSIS — M25561 Pain in right knee: Secondary | ICD-10-CM

## 2012-02-20 ENCOUNTER — Encounter (HOSPITAL_COMMUNITY)
Admission: RE | Admit: 2012-02-20 | Discharge: 2012-02-20 | Disposition: A | Discharge status: Home / Self Care | Payer: Medicare Other | Source: Ambulatory Visit | Attending: Orthopedic Surgery | Admitting: Orthopedic Surgery

## 2012-02-20 DIAGNOSIS — M25561 Pain in right knee: Secondary | ICD-10-CM

## 2012-02-20 DIAGNOSIS — M25569 Pain in unspecified knee: Secondary | ICD-10-CM | POA: Insufficient documentation

## 2012-02-20 DIAGNOSIS — Z96659 Presence of unspecified artificial knee joint: Secondary | ICD-10-CM | POA: Insufficient documentation

## 2012-02-20 MED ORDER — TECHNETIUM TC 99M MEDRONATE IV KIT
23.5000 | PACK | Freq: Once | INTRAVENOUS | Status: AC | PRN
  Administered 2012-02-20: 23.5 via INTRAVENOUS

## 2012-02-28 ENCOUNTER — Other Ambulatory Visit: Payer: Self-pay | Admitting: Orthopedic Surgery

## 2012-03-06 ENCOUNTER — Encounter (HOSPITAL_COMMUNITY): Payer: Self-pay | Admitting: Pharmacy Technician

## 2012-03-11 ENCOUNTER — Encounter (HOSPITAL_COMMUNITY): Payer: Self-pay

## 2012-03-11 ENCOUNTER — Encounter (HOSPITAL_COMMUNITY)
Admission: RE | Admit: 2012-03-11 | Discharge: 2012-03-11 | Disposition: A | Discharge status: Home / Self Care | Payer: Medicare Other | Source: Ambulatory Visit | Attending: Orthopedic Surgery | Admitting: Orthopedic Surgery

## 2012-03-11 ENCOUNTER — Ambulatory Visit (HOSPITAL_COMMUNITY)
Admission: RE | Admit: 2012-03-11 | Discharge: 2012-03-11 | Disposition: A | Discharge status: Home / Self Care | Payer: Medicare Other | Source: Ambulatory Visit | Attending: Orthopedic Surgery | Admitting: Orthopedic Surgery

## 2012-03-11 DIAGNOSIS — I498 Other specified cardiac arrhythmias: Secondary | ICD-10-CM | POA: Insufficient documentation

## 2012-03-11 DIAGNOSIS — R51 Headache: Secondary | ICD-10-CM | POA: Insufficient documentation

## 2012-03-11 DIAGNOSIS — Z01810 Encounter for preprocedural cardiovascular examination: Secondary | ICD-10-CM | POA: Insufficient documentation

## 2012-03-11 DIAGNOSIS — Z01818 Encounter for other preprocedural examination: Secondary | ICD-10-CM | POA: Insufficient documentation

## 2012-03-11 DIAGNOSIS — Z01812 Encounter for preprocedural laboratory examination: Secondary | ICD-10-CM | POA: Insufficient documentation

## 2012-03-11 DIAGNOSIS — R0602 Shortness of breath: Secondary | ICD-10-CM | POA: Insufficient documentation

## 2012-03-11 HISTORY — DX: Headache: R51

## 2012-03-11 HISTORY — DX: Shortness of breath: R06.02

## 2012-03-11 HISTORY — DX: Anxiety disorder, unspecified: F41.9

## 2012-03-11 LAB — CBC WITH DIFFERENTIAL/PLATELET
Basophils Absolute: 0 10*3/uL (ref 0.0–0.1)
Eosinophils Absolute: 0.1 10*3/uL (ref 0.0–0.7)
Eosinophils Relative: 3 % (ref 0–5)
MCH: 31.2 pg (ref 26.0–34.0)
MCHC: 34.4 g/dL (ref 30.0–36.0)
MCV: 90.7 fL (ref 78.0–100.0)
Monocytes Absolute: 0.3 10*3/uL (ref 0.1–1.0)
Platelets: 146 10*3/uL — ABNORMAL LOW (ref 150–400)
RDW: 12.6 % (ref 11.5–15.5)

## 2012-03-11 LAB — URINALYSIS, ROUTINE W REFLEX MICROSCOPIC
Glucose, UA: NEGATIVE mg/dL
Ketones, ur: NEGATIVE mg/dL
Leukocytes, UA: NEGATIVE
Nitrite: NEGATIVE
Protein, ur: NEGATIVE mg/dL
pH: 7.5 (ref 5.0–8.0)

## 2012-03-11 LAB — COMPREHENSIVE METABOLIC PANEL
ALT: 12 U/L (ref 0–35)
AST: 20 U/L (ref 0–37)
Calcium: 9.2 mg/dL (ref 8.4–10.5)
Creatinine, Ser: 0.73 mg/dL (ref 0.50–1.10)
GFR calc Af Amer: >90 mL/min (ref >90)
Sodium: 142 mEq/L (ref 135–145)
Total Protein: 6.8 g/dL (ref 6.0–8.3)

## 2012-03-11 LAB — TYPE AND SCREEN
ABO/RH(D): B POS
Antibody Screen: NEGATIVE

## 2012-03-11 LAB — PROTIME-INR: INR: 1.04 (ref 0.00–1.49)

## 2012-03-11 LAB — SURGICAL PCR SCREEN: Staphylococcus aureus: NEGATIVE

## 2012-03-11 LAB — ABO/RH: ABO/RH(D): B POS

## 2012-03-11 NOTE — Pre-Procedure Instructions (Signed)
Angela Burnett  03/11/2012   Your procedure is scheduled on:  03/18/12  Report to [facility] at 645 AM.  Call this number if you have problems the morning of surgery: [phone]   Remember:   Do not eat food or drink liquids after midnight.   Take these medicines the morning of surgery with A SIP OF WATER: pain med.,robaxin   Do not wear jewelry, make-up or nail polish.  Do not wear lotions, powders, or perfumes. You may wear deodorant.  Do not shave 48 hours prior to surgery. Men may shave face and neck.  Do not bring valuables to the hospital.  Contacts, dentures or bridgework may not be worn into surgery.  Leave suitcase in the car. After surgery it may be brought to your room.  For patients admitted to the hospital, checkout time is 11:00 AM the day of  discharge.   Patients discharged the day of surgery will not be allowed to drive  home.  Name and phone number of your driver: family  Special Instructions: Shower using CHG 2 nights before surgery and the night before surgery.  If you shower the day of surgery use CHG.  Use special wash - you have one bottle of CHG for all showers.  You should use approximately 1/3 of the bottle for each shower.   Please read over the following fact sheets that you were given: Pain Booklet, Coughing and Deep Breathing, Blood Transfusion Information, MRSA Information and Surgical Site Infection Prevention

## 2012-03-18 ENCOUNTER — Encounter (HOSPITAL_COMMUNITY): Payer: Self-pay | Admitting: Certified Registered Nurse Anesthetist

## 2012-03-18 ENCOUNTER — Inpatient Hospital Stay (HOSPITAL_COMMUNITY): Payer: Medicare Other | Admitting: Certified Registered Nurse Anesthetist

## 2012-03-18 ENCOUNTER — Encounter (HOSPITAL_COMMUNITY)
Admission: RE | Disposition: A | Discharge status: Home Health | Payer: Self-pay | Source: Ambulatory Visit | Attending: Orthopedic Surgery

## 2012-03-18 ENCOUNTER — Inpatient Hospital Stay (HOSPITAL_COMMUNITY)
Admission: RE | Admit: 2012-03-18 | Discharge: 2012-03-19 | DRG: 467 | Disposition: A | Discharge status: Home Health | Payer: Medicare Other | Source: Ambulatory Visit | Attending: Orthopedic Surgery | Admitting: Orthopedic Surgery

## 2012-03-18 DIAGNOSIS — T84099A Other mechanical complication of unspecified internal joint prosthesis, initial encounter: Principal | ICD-10-CM | POA: Diagnosis present

## 2012-03-18 DIAGNOSIS — M25562 Pain in left knee: Secondary | ICD-10-CM

## 2012-03-18 DIAGNOSIS — Z87891 Personal history of nicotine dependence: Secondary | ICD-10-CM

## 2012-03-18 DIAGNOSIS — Z7901 Long term (current) use of anticoagulants: Secondary | ICD-10-CM

## 2012-03-18 DIAGNOSIS — Z96659 Presence of unspecified artificial knee joint: Secondary | ICD-10-CM

## 2012-03-18 DIAGNOSIS — Y831 Surgical operation with implant of artificial internal device as the cause of abnormal reaction of the patient, or of later complication, without mention of misadventure at the time of the procedure: Secondary | ICD-10-CM | POA: Diagnosis present

## 2012-03-18 DIAGNOSIS — D62 Acute posthemorrhagic anemia: Secondary | ICD-10-CM | POA: Diagnosis not present

## 2012-03-18 DIAGNOSIS — M25569 Pain in unspecified knee: Secondary | ICD-10-CM

## 2012-03-18 DIAGNOSIS — F411 Generalized anxiety disorder: Secondary | ICD-10-CM | POA: Diagnosis present

## 2012-03-18 DIAGNOSIS — Z79899 Other long term (current) drug therapy: Secondary | ICD-10-CM

## 2012-03-18 DIAGNOSIS — Y92009 Unspecified place in unspecified non-institutional (private) residence as the place of occurrence of the external cause: Secondary | ICD-10-CM

## 2012-03-18 DIAGNOSIS — Z7982 Long term (current) use of aspirin: Secondary | ICD-10-CM

## 2012-03-18 HISTORY — PX: EXCISIONAL TOTAL KNEE ARTHROPLASTY: SHX5015

## 2012-03-18 LAB — GRAM STAIN

## 2012-03-18 LAB — CBC
HCT: 30.7 % — ABNORMAL LOW (ref 36.0–46.0)
Hemoglobin: 10.9 g/dL — ABNORMAL LOW (ref 12.0–15.0)
MCH: 31.6 pg (ref 26.0–34.0)
RBC: 3.45 MIL/uL — ABNORMAL LOW (ref 3.87–5.11)

## 2012-03-18 LAB — SYNOVIAL CELL COUNT + DIFF, W/ CRYSTALS: Crystals, Fluid: NONE SEEN

## 2012-03-18 LAB — CREATININE, SERUM: Creatinine, Ser: 0.57 mg/dL (ref 0.50–1.10)

## 2012-03-18 LAB — PATHOLOGIST SMEAR REVIEW

## 2012-03-18 SURGERY — EXCISIONAL TOTAL KNEE ARTHROPLASTY
Anesthesia: Regional | Site: Knee | Laterality: Right | Wound class: Clean

## 2012-03-18 MED ORDER — SENNOSIDES-DOCUSATE SODIUM 8.6-50 MG PO TABS: 1.0000 | ORAL_TABLET | Freq: Every evening | ORAL | Status: DC | PRN

## 2012-03-18 MED ORDER — DEXTROSE 5 % IV SOLN
INTRAVENOUS | Status: DC | PRN
  Administered 2012-03-18: 10:00:00 via INTRAVENOUS

## 2012-03-18 MED ORDER — MENTHOL 3 MG MT LOZG: 1.0000 | LOZENGE | OROMUCOSAL | Status: DC | PRN

## 2012-03-18 MED ORDER — FENTANYL CITRATE 0.05 MG/ML IJ SOLN
INTRAMUSCULAR | Status: DC | PRN
  Administered 2012-03-18 (×6): 50 ug via INTRAVENOUS

## 2012-03-18 MED ORDER — PHENOL 1.4 % MT LIQD: 1.0000 | OROMUCOSAL | Status: DC | PRN

## 2012-03-18 MED ORDER — BUPIVACAINE-EPINEPHRINE PF 0.5-1:200000 % IJ SOLN
INTRAMUSCULAR | Status: DC | PRN
  Administered 2012-03-18: 30 mL

## 2012-03-18 MED ORDER — CEFAZOLIN SODIUM 1-5 GM-% IV SOLN
1.0000 g | Freq: Once | INTRAVENOUS | Status: AC
  Administered 2012-03-18: 1 g via INTRAVENOUS

## 2012-03-18 MED ORDER — MORPHINE SULFATE 2 MG/ML IJ SOLN: 2.0000 mg | INTRAMUSCULAR | Status: DC | PRN

## 2012-03-18 MED ORDER — ONDANSETRON HCL 4 MG PO TABS: 4.0000 mg | ORAL_TABLET | Freq: Four times a day (QID) | ORAL | Status: DC | PRN

## 2012-03-18 MED ORDER — METHOCARBAMOL 100 MG/ML IJ SOLN
500.0000 mg | Freq: Four times a day (QID) | INTRAVENOUS | Status: DC | PRN
  Filled 2012-03-18: qty 5

## 2012-03-18 MED ORDER — ONDANSETRON HCL 4 MG/2ML IJ SOLN: 4.0000 mg | Freq: Four times a day (QID) | INTRAMUSCULAR | Status: DC | PRN

## 2012-03-18 MED ORDER — LACTATED RINGERS IV SOLN
INTRAVENOUS | Status: DC | PRN
  Administered 2012-03-18 (×2): via INTRAVENOUS

## 2012-03-18 MED ORDER — HYDROMORPHONE HCL PF 1 MG/ML IJ SOLN
0.2500 mg | INTRAMUSCULAR | Status: DC | PRN
  Administered 2012-03-18: 0.5 mg via INTRAVENOUS

## 2012-03-18 MED ORDER — ONDANSETRON HCL 4 MG/2ML IJ SOLN: 4.0000 mg | Freq: Once | INTRAMUSCULAR | Status: DC | PRN

## 2012-03-18 MED ORDER — METOCLOPRAMIDE HCL 10 MG PO TABS: 5.0000 mg | ORAL_TABLET | Freq: Three times a day (TID) | ORAL | Status: DC | PRN

## 2012-03-18 MED ORDER — SODIUM CHLORIDE 0.9 % IV SOLN
INTRAVENOUS | Status: DC
  Administered 2012-03-18: 17:00:00 via INTRAVENOUS

## 2012-03-18 MED ORDER — OXYCODONE HCL 5 MG PO TABS
5.0000 mg | ORAL_TABLET | Freq: Once | ORAL | Status: DC | PRN
Start: 2012-03-18 — End: 2012-03-18

## 2012-03-18 MED ORDER — ACETAMINOPHEN 10 MG/ML IV SOLN
1000.0000 mg | Freq: Four times a day (QID) | INTRAVENOUS | Status: AC
  Administered 2012-03-18 – 2012-03-19 (×4): 1000 mg via INTRAVENOUS
  Filled 2012-03-18 (×4): qty 100

## 2012-03-18 MED ORDER — MEPERIDINE HCL 25 MG/ML IJ SOLN: 6.2500 mg | INTRAMUSCULAR | Status: DC | PRN

## 2012-03-18 MED ORDER — LIDOCAINE HCL (CARDIAC) 20 MG/ML IV SOLN
INTRAVENOUS | Status: DC | PRN
  Administered 2012-03-18: 100 mg via INTRAVENOUS

## 2012-03-18 MED ORDER — BUPIVACAINE-EPINEPHRINE 0.25% -1:200000 IJ SOLN
INTRAMUSCULAR | Status: DC | PRN
  Administered 2012-03-18: 20 mL

## 2012-03-18 MED ORDER — ACETAMINOPHEN 10 MG/ML IV SOLN
1000.0000 mg | Freq: Four times a day (QID) | INTRAVENOUS | Status: DC
  Administered 2012-03-18: 1000 mg via INTRAVENOUS

## 2012-03-18 MED ORDER — OXYCODONE HCL 5 MG/5ML PO SOLN: 5.0000 mg | Freq: Once | ORAL | Status: DC | PRN

## 2012-03-18 MED ORDER — OXYCODONE HCL 5 MG PO TABS
ORAL_TABLET | ORAL | Status: AC
  Administered 2012-03-18: 5 mg via ORAL
  Filled 2012-03-18: qty 1

## 2012-03-18 MED ORDER — CHLORHEXIDINE GLUCONATE 4 % EX LIQD: 60.0000 mL | Freq: Once | CUTANEOUS | Status: DC

## 2012-03-18 MED ORDER — BUPIVACAINE 0.25 % ON-Q PUMP SINGLE CATH 300ML
300.0000 mL | INJECTION | Freq: Once | Status: DC
  Filled 2012-03-18: qty 300

## 2012-03-18 MED ORDER — ACETAMINOPHEN 325 MG PO TABS: 650.0000 mg | ORAL_TABLET | Freq: Four times a day (QID) | ORAL | Status: DC | PRN

## 2012-03-18 MED ORDER — ALUM & MAG HYDROXIDE-SIMETH 200-200-20 MG/5ML PO SUSP: 30.0000 mL | ORAL | Status: DC | PRN

## 2012-03-18 MED ORDER — DOCUSATE SODIUM 100 MG PO CAPS
100.0000 mg | ORAL_CAPSULE | Freq: Two times a day (BID) | ORAL | Status: DC
  Administered 2012-03-18 – 2012-03-19 (×3): 100 mg via ORAL
  Filled 2012-03-18 (×4): qty 1

## 2012-03-18 MED ORDER — CEFAZOLIN SODIUM 1-5 GM-% IV SOLN
1.0000 g | Freq: Four times a day (QID) | INTRAVENOUS | Status: AC
  Administered 2012-03-18 (×2): 1 g via INTRAVENOUS
  Filled 2012-03-18 (×2): qty 50

## 2012-03-18 MED ORDER — BISACODYL 5 MG PO TBEC: 5.0000 mg | DELAYED_RELEASE_TABLET | Freq: Every day | ORAL | Status: DC | PRN

## 2012-03-18 MED ORDER — BUPIVACAINE ON-Q PAIN PUMP (FOR ORDER SET NO CHG)
INJECTION | Status: DC
  Filled 2012-03-18: qty 1

## 2012-03-18 MED ORDER — ZOLPIDEM TARTRATE 5 MG PO TABS: 5.0000 mg | ORAL_TABLET | Freq: Every evening | ORAL | Status: DC | PRN

## 2012-03-18 MED ORDER — METHOCARBAMOL 500 MG PO TABS
500.0000 mg | ORAL_TABLET | Freq: Four times a day (QID) | ORAL | Status: DC | PRN
  Administered 2012-03-18 – 2012-03-19 (×2): 500 mg via ORAL
  Filled 2012-03-18 (×2): qty 1

## 2012-03-18 MED ORDER — METOCLOPRAMIDE HCL 5 MG/ML IJ SOLN: 5.0000 mg | Freq: Three times a day (TID) | INTRAMUSCULAR | Status: DC | PRN

## 2012-03-18 MED ORDER — ACETAMINOPHEN 10 MG/ML IV SOLN
INTRAVENOUS | Status: AC
  Filled 2012-03-18: qty 100

## 2012-03-18 MED ORDER — OXYCODONE HCL 5 MG PO TABS
5.0000 mg | ORAL_TABLET | ORAL | Status: DC | PRN
  Administered 2012-03-19 (×2): 10 mg via ORAL
  Filled 2012-03-18 (×2): qty 2

## 2012-03-18 MED ORDER — PHENYLEPHRINE HCL 10 MG/ML IJ SOLN
INTRAMUSCULAR | Status: DC | PRN
  Administered 2012-03-18 (×4): 40 ug via INTRAVENOUS
  Administered 2012-03-18: 80 ug via INTRAVENOUS
  Administered 2012-03-18: 40 ug via INTRAVENOUS

## 2012-03-18 MED ORDER — HYDROMORPHONE HCL PF 1 MG/ML IJ SOLN
INTRAMUSCULAR | Status: AC
  Administered 2012-03-18: 0.5 mg via INTRAVENOUS
  Filled 2012-03-18: qty 1

## 2012-03-18 MED ORDER — MORPHINE SULFATE ER 15 MG PO TBCR
15.0000 mg | EXTENDED_RELEASE_TABLET | Freq: Two times a day (BID) | ORAL | Status: DC
  Administered 2012-03-18 – 2012-03-19 (×2): 15 mg via ORAL
  Filled 2012-03-18 (×2): qty 1

## 2012-03-18 MED ORDER — FLEET ENEMA 7-19 GM/118ML RE ENEM: 1.0000 | ENEMA | Freq: Once | RECTAL | Status: AC | PRN

## 2012-03-18 MED ORDER — SODIUM CHLORIDE 0.9 % IV SOLN: INTRAVENOUS | Status: DC

## 2012-03-18 MED ORDER — SODIUM CHLORIDE 0.9 % IR SOLN
Status: DC | PRN
  Administered 2012-03-18: 3000 mL
  Administered 2012-03-18: 1000 mL

## 2012-03-18 MED ORDER — ENOXAPARIN SODIUM 30 MG/0.3ML ~~LOC~~ SOLN
30.0000 mg | Freq: Two times a day (BID) | SUBCUTANEOUS | Status: DC
  Administered 2012-03-19: 30 mg via SUBCUTANEOUS
  Filled 2012-03-18 (×3): qty 0.3

## 2012-03-18 MED ORDER — ACETAMINOPHEN 650 MG RE SUPP: 650.0000 mg | Freq: Four times a day (QID) | RECTAL | Status: DC | PRN

## 2012-03-18 MED ORDER — DIPHENHYDRAMINE HCL 12.5 MG/5ML PO ELIX: 12.5000 mg | ORAL_SOLUTION | ORAL | Status: DC | PRN

## 2012-03-18 MED ORDER — BUPIVACAINE-EPINEPHRINE 0.25% -1:200000 IJ SOLN
INTRAMUSCULAR | Status: AC
  Filled 2012-03-18: qty 1

## 2012-03-18 MED ORDER — CEFAZOLIN SODIUM 1-5 GM-% IV SOLN
INTRAVENOUS | Status: AC
  Filled 2012-03-18: qty 50

## 2012-03-18 MED ORDER — BUPIVACAINE 0.25 % ON-Q PUMP SINGLE CATH 300ML
INJECTION | Status: DC | PRN
  Administered 2012-03-18: 300 mL

## 2012-03-18 MED ORDER — MIDAZOLAM HCL 5 MG/5ML IJ SOLN
INTRAMUSCULAR | Status: DC | PRN
  Administered 2012-03-18: 2 mg via INTRAVENOUS

## 2012-03-18 MED ORDER — ARTIFICIAL TEARS OP OINT
TOPICAL_OINTMENT | OPHTHALMIC | Status: DC | PRN
  Administered 2012-03-18: 1 via OPHTHALMIC

## 2012-03-18 MED ORDER — ONDANSETRON HCL 4 MG/2ML IJ SOLN
INTRAMUSCULAR | Status: DC | PRN
  Administered 2012-03-18: 4 mg via INTRAVENOUS

## 2012-03-18 MED ORDER — PROPOFOL 10 MG/ML IV BOLUS
INTRAVENOUS | Status: DC | PRN
  Administered 2012-03-18: 160 mg via INTRAVENOUS

## 2012-03-18 MED ORDER — CELECOXIB 200 MG PO CAPS
200.0000 mg | ORAL_CAPSULE | Freq: Two times a day (BID) | ORAL | Status: DC
  Filled 2012-03-18 (×2): qty 1

## 2012-03-18 SURGICAL SUPPLY — 67 items
BAG URINE DRAINAGE (UROLOGICAL SUPPLIES) IMPLANT
BANDAGE ESMARK 6X9 LF (GAUZE/BANDAGES/DRESSINGS) ×1 IMPLANT
BLADE SAW SGTL 83.5X18.5 (BLADE) ×2 IMPLANT
BLADE SAW SGTL NAR THIN XSHT (BLADE) ×2 IMPLANT
BNDG ESMARK 6X9 LF (GAUZE/BANDAGES/DRESSINGS) ×2
BOWL SMART MIX CTS (DISPOSABLE) ×2 IMPLANT
CATH KIT ON Q 7.5IN SLV (PAIN MANAGEMENT) ×2 IMPLANT
CLOTH BEACON ORANGE TIMEOUT ST (SAFETY) ×2 IMPLANT
CONT SPEC 4OZ CLIKSEAL STRL BL (MISCELLANEOUS) ×4 IMPLANT
COVER BACK TABLE 24X17X13 BIG (DRAPES) IMPLANT
COVER SURGICAL LIGHT HANDLE (MISCELLANEOUS) ×2 IMPLANT
CUFF TOURNIQUET SINGLE 34IN LL (TOURNIQUET CUFF) ×2 IMPLANT
DRAPE EXTREMITY T 121X128X90 (DRAPE) ×2 IMPLANT
DRAPE INCISE IOBAN 66X45 STRL (DRAPES) ×4 IMPLANT
DRAPE PROXIMA HALF (DRAPES) ×2 IMPLANT
DRAPE U-SHAPE 47X51 STRL (DRAPES) ×2 IMPLANT
DRSG ADAPTIC 3X8 NADH LF (GAUZE/BANDAGES/DRESSINGS) ×2 IMPLANT
DRSG PAD ABDOMINAL 8X10 ST (GAUZE/BANDAGES/DRESSINGS) ×2 IMPLANT
DURAPREP 26ML APPLICATOR (WOUND CARE) ×4 IMPLANT
ELECT REM PT RETURN 9FT ADLT (ELECTROSURGICAL) ×2
ELECTRODE REM PT RTRN 9FT ADLT (ELECTROSURGICAL) ×1 IMPLANT
EVACUATOR 1/8 PVC DRAIN (DRAIN) ×2 IMPLANT
FLUID NSS /IRRIG 3000 ML XXX (IV SOLUTION) ×2 IMPLANT
GEL ULTRASOUND 20GR AQUASONIC (MISCELLANEOUS) IMPLANT
GLOVE BIO SURGEON STRL SZ8.5 (GLOVE) ×2 IMPLANT
GLOVE BIOGEL M 7.0 STRL (GLOVE) ×2 IMPLANT
GLOVE BIOGEL PI IND STRL 7.5 (GLOVE) IMPLANT
GLOVE BIOGEL PI IND STRL 8.5 (GLOVE) ×1 IMPLANT
GLOVE BIOGEL PI INDICATOR 7.5 (GLOVE)
GLOVE BIOGEL PI INDICATOR 8.5 (GLOVE) ×1
GLOVE SURG ORTHO 7.0 STRL STRW (GLOVE) IMPLANT
GLOVE SURG ORTHO 8.0 STRL STRW (GLOVE) ×2 IMPLANT
GLOVE SURG SS PI 8.5 STRL IVOR (GLOVE) ×1
GLOVE SURG SS PI 8.5 STRL STRW (GLOVE) ×1 IMPLANT
GOWN PREVENTION PLUS XLARGE (GOWN DISPOSABLE) ×2 IMPLANT
GOWN PREVENTION PLUS XXLARGE (GOWN DISPOSABLE) ×2 IMPLANT
GOWN STRL NON-REIN LRG LVL3 (GOWN DISPOSABLE) ×2 IMPLANT
HANDPIECE INTERPULSE COAX TIP (DISPOSABLE) ×1
HOOD PEEL AWAY FACE SHEILD DIS (HOOD) ×6 IMPLANT
INSERT PFC SIG STB SZ 2.5 15.5 (Knees) ×2 IMPLANT
KIT BASIN OR (CUSTOM PROCEDURE TRAY) ×2 IMPLANT
KIT ROOM TURNOVER OR (KITS) ×2 IMPLANT
MANIFOLD NEPTUNE II (INSTRUMENTS) ×2 IMPLANT
NEEDLE 18GX1X1/2 (RX/OR ONLY) (NEEDLE) IMPLANT
NEEDLE HYPO 25GX1X1/2 BEV (NEEDLE) ×2 IMPLANT
NS IRRIG 1000ML POUR BTL (IV SOLUTION) ×2 IMPLANT
PACK TOTAL JOINT (CUSTOM PROCEDURE TRAY) ×2 IMPLANT
PAD ARMBOARD 7.5X6 YLW CONV (MISCELLANEOUS) ×2 IMPLANT
PADDING CAST COTTON 6X4 STRL (CAST SUPPLIES) ×2 IMPLANT
SET HNDPC FAN SPRY TIP SCT (DISPOSABLE) ×1 IMPLANT
SPONGE GAUZE 4X4 12PLY (GAUZE/BANDAGES/DRESSINGS) ×2 IMPLANT
STAPLER VISISTAT 35W (STAPLE) ×2 IMPLANT
SUCTION FRAZIER TIP 10 FR DISP (SUCTIONS) IMPLANT
SUT VIC AB 0 CTB1 27 (SUTURE) ×6 IMPLANT
SUT VIC AB 1 CT1 27 (SUTURE) ×2
SUT VIC AB 1 CT1 27XBRD ANBCTR (SUTURE) ×2 IMPLANT
SUT VIC AB 2-0 CT1 27 (SUTURE) ×2
SUT VIC AB 2-0 CT1 TAPERPNT 27 (SUTURE) ×2 IMPLANT
SYR 20CC LL (SYRINGE) ×2 IMPLANT
SYR 20ML ECCENTRIC (SYRINGE) IMPLANT
SYR CONTROL 10ML LL (SYRINGE) ×2 IMPLANT
SYRINGE 10CC LL (SYRINGE) ×2 IMPLANT
TOWEL OR 17X24 6PK STRL BLUE (TOWEL DISPOSABLE) ×2 IMPLANT
TOWEL OR 17X26 10 PK STRL BLUE (TOWEL DISPOSABLE) ×2 IMPLANT
TRAY FOLEY CATH 14FR (SET/KITS/TRAYS/PACK) ×2 IMPLANT
TUBE ANAEROBIC SPECIMEN COL (MISCELLANEOUS) ×2 IMPLANT
WATER STERILE IRR 1000ML POUR (IV SOLUTION) IMPLANT

## 2012-03-18 NOTE — Op Note (Signed)
NAME:  Angela Burnett, Angela Burnett NO.:  [id number]  MEDICAL RECORD NO.:  [id number]  LOCATION:  5N22C                        FACILITY:  [facility]  PHYSICIAN:  Mila Homer. Sherlean Foot, M.D. DATE OF BIRTH:  [birth year]  DATE OF PROCEDURE:  03/18/2012 DATE OF DISCHARGE:                              OPERATIVE REPORT   SURGEON:  Mila Homer. Sherlean Foot, MD  ASSISTANT:  Skip Mayer, PA-C  ANESTHESIA:  General.  PREOPERATIVE DIAGNOSIS:  Right knee painful total knee arthroplasty with flexion and extension contractures.  INDICATION FOR PROCEDURE:  The patient is a 62 year old, with both flexion-extension contractures and a painful right total knee arthroplasty in 2 years with failure conservative measures.  Informed consent was obtained.  DESCRIPTION OF PROCEDURE:  The patient was laid supine and administered general anesthesia.  Right leg was prepped and draped in usual sterile fashion.  The extremity was exsanguinated with an Esmarch.  Tourniquet was inflated to 350 mmHg and set for an hour.  I then made an incision with using the old incision, and used a fresh blade to treat some skin flaps.  There was lot of scar to tissue external to the joint and the quad tendon and patellar tendon was very, very thick and scar tissue synovitis was clear.  I then meticulously carved out really the entirety of the synovium reestablishing the medial and lateral gutters making sure digitally there was no adhesions to the clot anteriorly on the femur.  I took the medial sleeve around to and through the attachment of semimembranosus tendon.  I evaluated the patella tracking, it was excellent.  I felt that the femoral component was rotated appropriately, the tibial component was rotated appropriately plus there was a mobile bearing knee.  I do think that the mobile bearing was not mobile at all and the scar tissue and captured it and some effective internal rotation, probably causing the patient's clinical  symptoms of out- toeing.  I removed the polyethylene and removed scar tissue in the back of the knee, and released the capsule both off the posterior femur, as well, I tried to go around the posterior medial side unless this is much for safe.  I then trialed with the same size 2-1/2 tibia 15 mm thick and I did think to eliminate from a 10 degree contracture of extension to about 2 degrees contracture and from flexion of 95 degrees to flexion of at least 120.  I then lavaged, opened up the real polyethylene snapped that into place, left a Hemovac coming out superolaterally deep the arthrotomy, pain catheter coming out superomedially and superficially arthrotomy.  Closed the arthrotomy with figure-of-eight 1 Vicryl sutures, buried 0 Vicryl sutures in the deep soft tissue subcuticular stitch and skin staples.  Dressed with Xeroform, dressing sponges, sterile Webril, and Ace wrap.  COMPLICATIONS:  None.  DRAINS PLACED:  None.          ______________________________ Mila Homer. Sherlean Foot, M.D.     SDL/MEDQ  D:  03/18/2012  T:  03/18/2012  Job:  [id number]

## 2012-03-18 NOTE — Anesthesia Preprocedure Evaluation (Addendum)
Anesthesia Evaluation  Patient identified by MRN, date of birth, ID band Patient awake    Reviewed: Allergy & Precautions, H&P , NPO status , Patient's Chart, lab work & pertinent test results, reviewed documented beta blocker date and time   History of Anesthesia Complications Negative for: history of anesthetic complications  Airway Mallampati: II TM Distance: >3 FB Neck ROM: Full    Dental  (+) Teeth Intact and Dental Advisory Given   Pulmonary shortness of breath, former smoker,    Pulmonary exam normal       Cardiovascular Rhythm:Regular Rate:Normal   Pt with chest pain, SOB.  Negative cardiac cath 09-2009.   Neuro/Psych  Headaches, Anxiety Occasional headaches    GI/Hepatic Neg liver ROS, GERD-  Controlled,  Endo/Other  negative endocrine ROS  Renal/GU   negative genitourinary   Musculoskeletal   Abdominal Normal abdominal exam  (+)   Peds  Hematology negative hematology ROS (+)   Anesthesia Other Findings   Reproductive/Obstetrics                          Anesthesia Physical Anesthesia Plan  ASA: II  Anesthesia Plan: General   Post-op Pain Management:    Induction: Intravenous  Airway Management Planned: LMA  Additional Equipment:   Intra-op Plan:   Post-operative Plan: Extubation in OR  Informed Consent: I have reviewed the patients History and Physical, chart, labs and discussed the procedure including the risks, benefits and alternatives for the proposed anesthesia with the patient or authorized representative who has indicated his/her understanding and acceptance.   Dental advisory given  Plan Discussed with: CRNA and Surgeon  Anesthesia Plan Comments:        Anesthesia Quick Evaluation

## 2012-03-18 NOTE — Anesthesia Postprocedure Evaluation (Signed)
Anesthesia Post Note  Patient: Angela Burnett  Procedure(s) Performed: Procedure(s) (LRB): EXCISIONAL TOTAL KNEE ARTHROPLASTY (Right)  Anesthesia type: general  Patient location: PACU  Post pain: Pain level controlled  Post assessment: Patient's Cardiovascular Status Stable  Last Vitals:  Filed Vitals:   03/18/12 1045  BP: 116/75  Pulse: 84  Temp: 36.5 C  Resp: 31    Post vital signs: Reviewed and stable  Level of consciousness: sedated  Complications: No apparent anesthesia complications

## 2012-03-18 NOTE — Transfer of Care (Signed)
Immediate Anesthesia Transfer of Care Note  Patient: Angela Burnett  Procedure(s) Performed: Procedure(s) with comments: EXCISIONAL TOTAL KNEE ARTHROPLASTY (Right) - right total knee polyexchange   Patient Location: PACU  Anesthesia Type:General  Level of Consciousness: awake, alert  and oriented  Airway & Oxygen Therapy: Patient Spontanous Breathing and Patient connected to nasal cannula oxygen  Post-op Assessment: Report given to PACU RN, Post -op Vital signs reviewed and stable and Patient moving all extremities  Post vital signs: Reviewed and stable  Complications: No apparent anesthesia complications

## 2012-03-18 NOTE — H&P (Signed)
Angela Burnett MRN:  [id number] DOB/SEX:  12/04/1950/female  CHIEF COMPLAINT:  Painful right Knee  HISTORY: Patient is a 62 y.o. female presented with a history of pain in the right knee. Onset of symptoms was abrupt starting several months ago with gradually worsening course since that time. Prior procedures on the knee include arthroplasty. Patient has been treated conservatively with over-the-counter NSAIDs and activity modification. Patient currently rates pain in the knee at 9 out of 10 with activity. There is pain at night.  PAST MEDICAL HISTORY: There are no active problems to display for this patient.  Past Medical History  Diagnosis Date  . Shortness of breath   . Headache   . Anxiety    Past Surgical History  Procedure Laterality Date  . Cardiac catheterization       clean    dr berry  . Joint replacement      x5 lt ankle  . Knee arthroplasty       MEDICATIONS:   Prescriptions prior to admission  Medication Sig Dispense Refill  . diclofenac sodium (VOLTAREN) 1 % GEL Apply topically as directed.       . methocarbamol (ROBAXIN) 500 MG tablet Take 500 mg by mouth 4 (four) times daily as needed (for muscle spasms).       . morphine (AVINZA) 60 MG 24 hr capsule Take 60 mg by mouth daily.      Marland Kitchen OVER THE COUNTER MEDICATION Take 1 tablet by mouth daily. Fiber Choice chewable      . oxyCODONE (OXY IR/ROXICODONE) 5 MG immediate release tablet Take 5 mg by mouth every 4 (four) hours as needed for pain.      Marland Kitchen aspirin EC 81 MG tablet Take 81 mg by mouth daily.        ALLERGIES:  No Known Allergies  REVIEW OF SYSTEMS:  Pertinent items are noted in HPI.   FAMILY HISTORY:  No family history on file.  SOCIAL HISTORY:   History  Substance Use Topics  . Smoking status: Former Smoker    Quit date: 03/11/2009  . Smokeless tobacco: Not on file  . Alcohol Use: Yes     Comment: occ     EXAMINATION:  Vital signs in last 24 hours:    General appearance: alert,  cooperative and no distress Lungs: clear to auscultation bilaterally Heart: regular rate and rhythm, S1, S2 normal, no murmur, click, rub or gallop Abdomen: soft, non-tender; bowel sounds normal; no masses,  no organomegaly Extremities: extremities normal, atraumatic, no cyanosis or edema and Homans sign is negative, no sign of DVT Pulses: 2+ and symmetric Skin: Skin color, texture, turgor normal. No rashes or lesions Neurologic: Alert and oriented X 3, normal strength and tone. Normal symmetric reflexes. Normal coordination and gait  Musculoskeletal:  ROM 0-115, Ligaments intact,  Imaging Review Plain radiographs demonstrate possible loosening of the right knee. The overall alignment is neutral. The bone quality appears to be good for age and reported activity level.  Assessment/Plan: Loosening vs infection, right knee   The patient history, physical examination and imaging studies are consistent with possible loosening degenerative joint disease of the right knee. The patient has failed conservative treatment.  The clearance notes were reviewed.  After discussion with the patient it was felt that Total Knee Revision vs poly exchange was indicated. The procedure,  risks, and benefits of total knee arthroplasty were presented and reviewed. The risks including but not limited to aseptic loosening, infection, blood clots, vascular injury,  stiffness, patella tracking problems complications among others were discussed. The patient acknowledged the explanation, agreed to proceed with the plan.  Angela Burnett 03/18/2012, 6:51 AM

## 2012-03-18 NOTE — OR Nursing (Signed)
OR Nursing Note: Specimens tubed to lab @ 0930, called and spoke to Coulter.  @ [phone] Results from frozen section called back and Dr. Sherlean Foot spoke with Pathologist.  @ [phone] Results from stat gram stain called back, Rare monos and No organism

## 2012-03-18 NOTE — Op Note (Signed)
Dictation Number:  [id number]

## 2012-03-18 NOTE — Evaluation (Signed)
Physical Therapy Evaluation Patient Details Name: Angela Burnett MRN: [id number] DOB: 03/24/1950 Today's Date: 03/18/2012 Time: 4782-9562 PT Time Calculation (min): 30 min  PT Assessment / Plan / Recommendation Clinical Impression  Patient is a 62 y/o female s/p right TKA revision with liner exchange who presents with decreased independence in mobility due to right LE weakness and decreased ROM with acute pain.  She will benefit from skilled PT in the acute setting to maximize independence with mobility and allow d/c home with spouse assist.    PT Assessment  Patient needs continued PT services    Follow Up Recommendations  Home health PT          Equipment Recommendations  None recommended by PT       Frequency Min 6X/week    Precautions / Restrictions Precautions Precautions: Fall;Knee Precaution Booklet Issued: No Restrictions Weight Bearing Restrictions: Yes RLE Weight Bearing: Weight bearing as tolerated   Pertinent Vitals/Pain 6/10 right knee      Mobility  Bed Mobility Bed Mobility: Supine to Sit Supine to Sit: 5: Supervision;HOB elevated Details for Bed Mobility Assistance: raised up into long sitting to do exercises, then assisted right leg to edge of bed Transfers Transfers: Sit to Stand;Stand to Sit;Stand Pivot Transfers Sit to Stand: 5: Supervision;From bed;With upper extremity assist Stand to Sit: 4: Min assist;With upper extremity assist;To chair/3-in-1 Stand Pivot Transfers: 4: Min assist Details for Transfer Assistance: stand turn with walker to chair with right knee buckling with weight bearing Ambulation/Gait Ambulation/Gait Assistance: Not tested (comment)    Exercises Total Joint Exercises Ankle Circles/Pumps: AROM;Both;5 reps;Supine Quad Sets: AROM;Right;5 reps;Supine   PT Diagnosis: Difficulty walking;Acute pain;Generalized weakness  PT Problem List: Decreased strength;Decreased range of motion;Pain;Decreased mobility PT Treatment  Interventions: DME instruction;Gait training;Stair training;Functional mobility training;Therapeutic activities;Therapeutic exercise;Patient/family education;Balance training   PT Goals Acute Rehab PT Goals PT Goal Formulation: With patient Time For Goal Achievement: 03/22/12 Potential to Achieve Goals: Good Pt will go Supine/Side to Sit: with modified independence Pt will go Sit to Stand: with modified independence PT Goal: Sit to Stand - Progress: Goal set today Pt will go Stand to Sit: with modified independence PT Goal: Stand to Sit - Progress: Goal set today Pt will Ambulate: >150 feet;with modified independence PT Goal: Ambulate - Progress: Goal set today Pt will Go Up / Down Stairs: 1-2 stairs;with rolling walker;with min assist PT Goal: Up/Down Stairs - Progress: Goal set today Pt will Perform Home Exercise Program: with supervision, verbal cues required/provided PT Goal: Perform Home Exercise Program - Progress: Goal set today  Visit Information  Last PT Received On: 03/18/12 Assistance Needed: +1    Subjective Data  Subjective: This is my third time around.  Figure I should know how it goes. Patient Stated Goal: To go home tomorrow.   Prior Functioning  Home Living Lives With: Spouse Available Help at Discharge: Family;Available 24 hours/day Type of Home: House Home Access: Stairs to enter [employer] of Steps: 2 Entrance Stairs-Rails: None Home Layout: Two level;Able to live on main level with bedroom/bathroom;Full bath on main level Bathroom Shower/Tub: Health visitor: Standard Home Adaptive Equipment: Shower chair without back;Walker - rolling;Straight cane Prior Function Level of Independence: Independent Able to Take Stairs?: Yes Driving: Yes Communication Communication: No difficulties    Cognition  Cognition Overall Cognitive Status: Appears within functional limits for tasks assessed/performed Arousal/Alertness:  Awake/alert Orientation Level: Appears intact for tasks assessed Behavior During Session: [facility] for tasks performed    Extremity/Trunk  Assessment Right Upper Extremity Assessment RUE ROM/Strength/Tone: Within functional levels Left Upper Extremity Assessment LUE ROM/Strength/Tone: Within functional levels Right Lower Extremity Assessment RLE ROM/Strength/Tone: Deficits RLE ROM/Strength/Tone Deficits: lacks about 25 degrees from full extension with knee press; flexion approx 70 degrees Left Lower Extremity Assessment LLE ROM/Strength/Tone: Within functional levels   Balance Balance Balance Assessed: Yes Static Sitting Balance Static Sitting - Balance Support: No upper extremity supported Static Sitting - Level of Assistance: 7: Independent Static Sitting - Comment/# of Minutes: sitting up in bed in long sitting and at edge of bed  End of Session PT - End of Session Activity Tolerance: Patient tolerated treatment well Patient left: in chair;with call bell/phone within reach CPM Right Knee CPM Right Knee: Off Right Knee Flexion (Degrees): 90 Right Knee Extension (Degrees): 0  GP     Angela Burnett,CYNDI 03/18/2012, 3:49 PM  Sheran Lawless, PT [phone] 03/18/2012

## 2012-03-18 NOTE — Progress Notes (Signed)
UR COMPLETED  

## 2012-03-18 NOTE — Anesthesia Procedure Notes (Signed)
Anesthesia Regional Block:  Femoral nerve block  Pre-Anesthetic Checklist: ,, timeout performed, Correct Patient, Correct Site, Correct Laterality, Correct Procedure, Correct Position, site marked, Risks and benefits discussed,  Surgical consent,  Pre-op evaluation,  At surgeon's request and post-op pain management  Laterality: Right  Prep: chloraprep       Needles:  Injection technique: Single-shot  Needle Type: Echogenic Stimulator Needle     Needle Length:cm 9 cm Needle Gauge: 21 G    Additional Needles:  Procedures: ultrasound guided (picture in chart) and nerve stimulator Femoral nerve block  Nerve Stimulator or Paresthesia:  Response: 0.4 mA,   Additional Responses:   Narrative:  Start time: 03/18/2012 8:20 AM End time: 03/18/2012 8:35 AM Injection made incrementally with aspirations every 5 mL.  Performed by: Personally  Anesthesiologist: Arta Bruce MD  Additional Notes: Monitors applied. Patient sedated. Sterile prep and drape,hand hygiene and sterile gloves were used. Relevant anatomy identified.Needle position confirmed.Local anesthetic injected incrementally after negative aspiration. Local anesthetic spread visualized around nerve(s). Vascular puncture avoided. No complications. Image printed for medical record.The patient tolerated the procedure well.       Femoral nerve block

## 2012-03-18 NOTE — Progress Notes (Signed)
Orthopedic Tech Progress Note Patient Details:  Angela Burnett 11/18/1950 [id number] CPM applied to Right LE with appropriate settings. OHF applied to bed.  CPM Right Knee CPM Right Knee: On Right Knee Flexion (Degrees): 90 Right Knee Extension (Degrees): 0 Additional Comments:  (trapeze bar on bed)   [place] R Thompson 03/18/2012, 12:08 PM

## 2012-03-18 NOTE — Preoperative (Signed)
Beta Blockers   Reason not to administer Beta Blockers:Not Applicable 

## 2012-03-19 ENCOUNTER — Encounter (HOSPITAL_COMMUNITY): Payer: Self-pay | Admitting: Orthopedic Surgery

## 2012-03-19 DIAGNOSIS — M25569 Pain in unspecified knee: Secondary | ICD-10-CM

## 2012-03-19 LAB — CBC
HCT: 29.9 % — ABNORMAL LOW (ref 36.0–46.0)
Platelets: 114 10*3/uL — ABNORMAL LOW (ref 150–400)
RDW: 12.8 % (ref 11.5–15.5)
WBC: 4.4 10*3/uL (ref 4.0–10.5)

## 2012-03-19 LAB — BASIC METABOLIC PANEL
BUN: 7 mg/dL (ref 6–23)
Chloride: 106 mEq/L (ref 96–112)
GFR calc Af Amer: >90 mL/min (ref >90)
Potassium: 4.2 mEq/L (ref 3.5–5.1)

## 2012-03-19 MED ORDER — ENOXAPARIN SODIUM 40 MG/0.4ML ~~LOC~~ SOLN: 40.0000 mg | SUBCUTANEOUS | Status: DC

## 2012-03-19 MED ORDER — CELECOXIB 200 MG PO CAPS: 200.0000 mg | ORAL_CAPSULE | Freq: Two times a day (BID) | ORAL | Status: DC

## 2012-03-19 MED ORDER — METHOCARBAMOL 500 MG PO TABS: 500.0000 mg | ORAL_TABLET | Freq: Four times a day (QID) | ORAL | Status: DC | PRN

## 2012-03-19 MED ORDER — OXYCODONE HCL 5 MG PO TABS: 5.0000 mg | ORAL_TABLET | ORAL | Status: DC | PRN

## 2012-03-19 NOTE — Progress Notes (Signed)
CARE MANAGEMENT NOTE 03/19/2012  Patient:  Angela Burnett, Angela Burnett   Account Number:  [id number]  Date Initiated:  03/19/2012  Documentation initiated by:  Vance Peper  Subjective/Objective Assessment:   62 yr old female s/p right total knee arthroplasty     Action/Plan:   Patient preoperatively setup with [facility], no changes. DME has been delivered to patient's home.   Anticipated DC Date:  03/19/2012   Anticipated DC Plan:  HOME W HOME HEALTH SERVICES      DC Planning Services  CM consult      [facility] Choice  HOME HEALTH   Choice offered to / List presented to:  C-1 Patient        HH arranged  HH-2 PT      [facility] agency  [facility]   Status of service:  Completed, signed off Medicare Important Message given?   (If response is "NO", the following Medicare IM given date fields will be blank) Date Medicare IM given:   Date Additional Medicare IM given:    Discharge Disposition:  HOME W HOME HEALTH SERVICES  Per UR Regulation:    If discussed at Long Length of Stay Meetings, dates discussed:    Comments:

## 2012-03-19 NOTE — Progress Notes (Signed)
PT Cancellation Note  Patient Details Name: Angela Burnett MRN: [id number] DOB: 23-Apr-1950   Cancelled Treatment:    Reason Eval/Treat Not Completed: Other (comment) (Pt in CPM.  ) Pt ceclined participating in PT at this time stating that she would rather finish her 2 hours in the CPM.   Pt reporting that this is her 3rd knee surgery and she is familiar with the rehab process.  Pt reports that she has been ambulating in her room and to/from the bathroom without assistance all morning.  Acute PT will continue to follow pt to progress rehab for TKA.  Pt planning to d/c today if cleared by PA.     Angela Burnett 03/19/2012, 11:40 AM Theron Arista L. Karsten Howry DPT [phone]

## 2012-03-19 NOTE — Discharge Summary (Signed)
SPORTS MEDICINE & JOINT REPLACEMENT   Angela Spurling, MD   Altamese Cabal, PA-C [address] [place], [place], [place]  [address]                             [phone]  PATIENT ID: Angela Burnett        MRN:  [id number]          DOB/AGE: 62-Apr-1952 / 62 y.o.    DISCHARGE SUMMARY  ADMISSION DATE:    03/18/2012 DISCHARGE DATE:   03/19/2012   ADMISSION DIAGNOSIS: failed right total knee    DISCHARGE DIAGNOSIS:  failed right total knee    ADDITIONAL DIAGNOSIS: Active Problems:   Pain, knee  Past Medical History  Diagnosis Date  . Shortness of breath   . Headache   . Anxiety     PROCEDURE: Procedure(s): EXCISIONAL TOTAL KNEE ARTHROPLASTY on 03/18/2012  CONSULTS:     HISTORY:  See H&P in chart  HOSPITAL COURSE:  Angela Burnett is a 62 y.o. admitted on 03/18/2012 and found to have a diagnosis of failed right total knee.  After appropriate laboratory studies were obtained  they were taken to the operating room on 03/18/2012 and underwent Procedure(s): EXCISIONAL TOTAL KNEE ARTHROPLASTY.   They were given perioperative antibiotics:  Anti-infectives   Start     Dose/Rate Route Frequency Ordered Stop   03/18/12 1600  ceFAZolin (ANCEF) IVPB 1 g/50 mL premix     1 g 100 mL/hr over 30 Minutes Intravenous Every 6 hours 03/18/12 1256 03/18/12 2224   03/18/12 0945  ceFAZolin (ANCEF) IVPB 1 g/50 mL premix     1 g 100 mL/hr over 30 Minutes Intravenous  Once 03/18/12 0937 03/18/12 0936    .  Tolerated the procedure well.  Placed with a foley intraoperatively.  Given Ofirmev at induction and for 48 hours.    POD# 1: Vital signs were stable.  Patient denied Chest pain, shortness of breath, or calf pain.  Patient was started on Lovenox 30 mg subcutaneously twice daily at 8am.  Consults to PT, OT, and care management were made.  The patient was weight bearing as tolerated.  CPM was placed on the operative leg 0-90 degrees for 6-8 hours a day.  Incentive spirometry was taught.   Dressing was changed.  Marcaine pump and hemovac were discontinued.          The remainder of the hospital course was dedicated to ambulation and strengthening.   The patient was discharged on 1 Day Post-Op in  Good condition.  Blood products given:none  DIAGNOSTIC STUDIES: Recent vital signs: Patient Vitals for the past 24 hrs:  BP Temp Pulse Resp SpO2  03/19/12 0600 116/72 mmHg 98 F (36.7 C) 81 19 100 %  03/19/12 0400 - - - 17 95 %  03/19/12 0125 113/61 mmHg 98.3 F (36.8 C) 91 18 94 %  03/19/12 0000 - - - 18 100 %  03/18/12 2126 105/52 mmHg 98 F (36.7 C) 73 18 100 %  03/18/12 2000 - - - 17 99 %  03/18/12 1600 - - - 16 95 %  03/18/12 1523 - - 78 - 93 %       Recent laboratory studies:  Recent Labs  03/18/12 1300 03/19/12 0700  WBC 6.0 4.4  HGB 10.9* 10.3*  HCT 30.7* 29.9*  PLT 120* 114*    Recent Labs  03/18/12 1300 03/19/12 0700  NA  --  142  K  --  4.2  CL  --  106  CO2  --  30  BUN  --  7  CREATININE 0.57 0.64  GLUCOSE  --  104*  CALCIUM  --  8.6   Lab Results  Component Value Date   INR 1.04 03/11/2012   INR 0.99 06/28/2010     Recent Radiographic Studies :  Dg Chest 2 View  03/11/2012  *RADIOLOGY REPORT*  Clinical Data: 62 year old female preoperative study.  Shortness of breath, headache.  CHEST - 2 VIEW  Comparison: 02/01/2010.  Findings: Stable lung volumes.  Cardiac size and mediastinal contours are within normal limits.  No pneumothorax, pulmonary edema, pleural effusion or confluent pulmonary opacity. Visualized tracheal air column is within normal limits.  Stable visualized osseous structures.  IMPRESSION: No acute cardiopulmonary abnormality.   Original Report Authenticated By: Erskine Speed, M.D.    Nm Bone Scan 3 Phase Lower Extremity  02/20/2012  *RADIOLOGY REPORT*  Clinical Data: Pain and swelling of the right knee.  Total knee replacement in June 2012.  NUCLEAR MEDICINE THREE PHASE BONE SCAN  Technique:  Radionuclide angiographic images,  immediate static blood pool images, and 3-hour delayed static images were obtained after intravenous injection of radiopharmaceutical.  Radiopharmaceutical: 23.5MILLI CURIE TC-MDP TECHNETIUM TC 28M MEDRONATE IV KIT  Comparison: Triple phase bone scan dated 03/22/2010  Findings: Perfusion images demonstrate increased blood flow or around the right knee as compared to the left.  Blood pool images show fairly intense activity of the synovium of the right knee.  Delayed bone images demonstrate increased activity of the tibia, femur, and patella of the right knee.  IMPRESSION: The increased activity on the blood flow imaging is suggestive of active inflammation of the right knee which could be infectious or sterile.  The bone activity seen on delayed imaging can be normal within 2 years of prosthesis insertion.   Original Report Authenticated By: Francene Boyers, M.D.     DISCHARGE INSTRUCTIONS: Discharge Orders   Future Orders Complete By Expires     CPM  As directed     Comments:      Continuous passive motion machine (CPM):      Use the CPM from 0 to 90 for 6-8 hours per day.      You may increase by 10 per day.  You may break it up into 2 or 3 sessions per day.      Use CPM for 2 weeks or until you are told to stop.    Call MD / Call 911  As directed     Comments:      If you experience chest pain or shortness of breath, CALL 911 and be transported to the hospital emergency room.  If you develope a fever above 101 F, pus (white drainage) or increased drainage or redness at the wound, or calf pain, call your surgeon's office.    Change dressing  As directed     Comments:      Change dressing on wednesday, then change the dressing daily with sterile 4 x 4 inch gauze dressing and apply TED hose.    Constipation Prevention  As directed     Comments:      Drink plenty of fluids.  Prune juice may be helpful.  You may use a stool softener, such as Colace (over the counter) 100 mg twice a day.  Use MiraLax  (over the counter) for constipation as needed.    Diet -  low sodium heart healthy  As directed     Do not put a pillow under the knee. Place it under the heel.  As directed     Driving restrictions  As directed     Comments:      No driving for 6 weeks    Increase activity slowly as tolerated  As directed     Lifting restrictions  As directed     Comments:      No lifting for 6 weeks    TED hose  As directed     Comments:      Use stockings (TED hose) for 3 weeks on both leg(s).  You may remove them at night for sleeping.       DISCHARGE MEDICATIONS:     Medication List    STOP taking these medications       VOLTAREN 1 % Gel  Generic drug:  diclofenac sodium      TAKE these medications       aspirin EC 81 MG tablet  Take 81 mg by mouth daily.     celecoxib 200 MG capsule  Commonly known as:  CELEBREX  Take 1 capsule (200 mg total) by mouth every 12 (twelve) hours.     enoxaparin 40 MG/0.4ML injection  Commonly known as:  LOVENOX  Inject 0.4 mLs (40 mg total) into the skin daily.     methocarbamol 500 MG tablet  Commonly known as:  ROBAXIN  Take 1 tablet (500 mg total) by mouth every 6 (six) hours as needed.     morphine 60 MG 24 hr capsule  Commonly known as:  AVINZA  Take 60 mg by mouth daily.     OVER THE COUNTER MEDICATION  Take 1 tablet by mouth daily. Fiber Choice chewable     oxyCODONE 5 MG immediate release tablet  Commonly known as:  Oxy IR/ROXICODONE  Take 1-2 tablets (5-10 mg total) by mouth every 3 (three) hours as needed.        FOLLOW UP VISIT:       Follow-up Information   Follow up with Raymon Mutton, MD. Call on 04/02/2012.   Contact information:   [address] [place] [address] [phone]       DISPOSITION: HOME  CONDITION:  Good   Landy Dunnavant 03/19/2012, 1:06 PM

## 2012-03-19 NOTE — Progress Notes (Signed)
SPORTS MEDICINE AND JOINT REPLACEMENT  Georgena Spurling, MD   Altamese Cabal, PA-C [address] [place], [place], [place]  [address]                             [phone]   PROGRESS NOTE  Subjective:  negative for Chest Pain  negative for Shortness of Breath  negative for Nausea/Vomiting   negative for Calf Pain  negative for Bowel Movement   Tolerating Diet: yes         Patient reports pain as 5 on 0-10 scale.    Objective: Vital signs in last 24 hours:   Patient Vitals for the past 24 hrs:  BP Temp Pulse Resp SpO2  03/19/12 0600 116/72 mmHg 98 F (36.7 C) 81 19 100 %  03/19/12 0400 - - - 17 95 %  03/19/12 0125 113/61 mmHg 98.3 F (36.8 C) 91 18 94 %  03/19/12 0000 - - - 18 100 %  03/18/12 2126 105/52 mmHg 98 F (36.7 C) 73 18 100 %  03/18/12 2000 - - - 17 99 %  03/18/12 1600 - - - 16 95 %  03/18/12 1523 - - 78 - 93 %  03/18/12 1215 108/58 mmHg 99 F (37.2 C) 67 18 100 %  03/18/12 1145 130/58 mmHg 98 F (36.7 C) 70 12 100 %  03/18/12 1130 94/74 mmHg - 72 15 99 %  03/18/12 1115 110/71 mmHg - 79 16 99 %  03/18/12 1100 115/70 mmHg - 72 12 99 %  03/18/12 1045 116/75 mmHg 97.7 F (36.5 C) 84 20 99 %    @flow {1959:LAST@   Intake/Output from previous day:   02/24 0701 - 02/25 0700 In: 1831.3 [I.V.:1831.3] Out: 2525 [Urine:2050; Drains:425]   Intake/Output this shift:       Intake/Output     02/24 0701 - 02/25 0700 02/25 0701 - 02/26 0700   I.V. 1831.3    Total Intake 1831.3     Urine 2050    Drains 425    Blood 50    Total Output 2525     Net -693.8             LABORATORY DATA:  Recent Labs  03/18/12 1300  WBC 6.0  HGB 10.9*  HCT 30.7*  PLT 120*    Recent Labs  03/18/12 1300  CREATININE 0.57   Lab Results  Component Value Date   INR 1.04 03/11/2012   INR 0.99 06/28/2010    Examination:  General appearance: alert, cooperative and no distress Extremities: Homans sign is negative, no sign of DVT  Wound Exam: clean, dry, intact    Drainage:  None: wound tissue dry  Motor Exam: EHL and FHL Intact  Sensory Exam: Deep Peroneal normal   Assessment:    1 Day Post-Op  Procedure(s) (LRB): EXCISIONAL TOTAL KNEE ARTHROPLASTY (Right)  ADDITIONAL DIAGNOSIS:  Active Problems:   * No active hospital problems. *  Acute Blood Loss Anemia   Plan: Physical Therapy as ordered Weight Bearing as Tolerated (WBAT)  DVT Prophylaxis:  Lovenox  DISCHARGE PLAN: Home  DISCHARGE NEEDS: HHPT, CPM, Walker and 3-in-1 comode seat         Angela Burnett 03/19/2012, 7:29 AM

## 2012-03-21 LAB — BODY FLUID CULTURE: Culture: NO GROWTH

## 2012-03-23 LAB — ANAEROBIC CULTURE

## 2014-03-19 DIAGNOSIS — M5136 Other intervertebral disc degeneration, lumbar region: Secondary | ICD-10-CM | POA: Diagnosis not present

## 2014-03-19 DIAGNOSIS — G894 Chronic pain syndrome: Secondary | ICD-10-CM | POA: Diagnosis not present

## 2014-03-19 DIAGNOSIS — G90522 Complex regional pain syndrome I of left lower limb: Secondary | ICD-10-CM | POA: Diagnosis not present

## 2014-03-19 DIAGNOSIS — Z79891 Long term (current) use of opiate analgesic: Secondary | ICD-10-CM | POA: Diagnosis not present

## 2014-06-02 IMAGING — CR DG CHEST 2V
2 series · 2 of 2 positions shown · non-contrast
Comparison: 02/01/2010.

CLINICAL DATA: 61-year-old female preoperative study.  Shortness of
breath, headache.

CHEST - 2 VIEW

[view not recorded (1 of 2)]
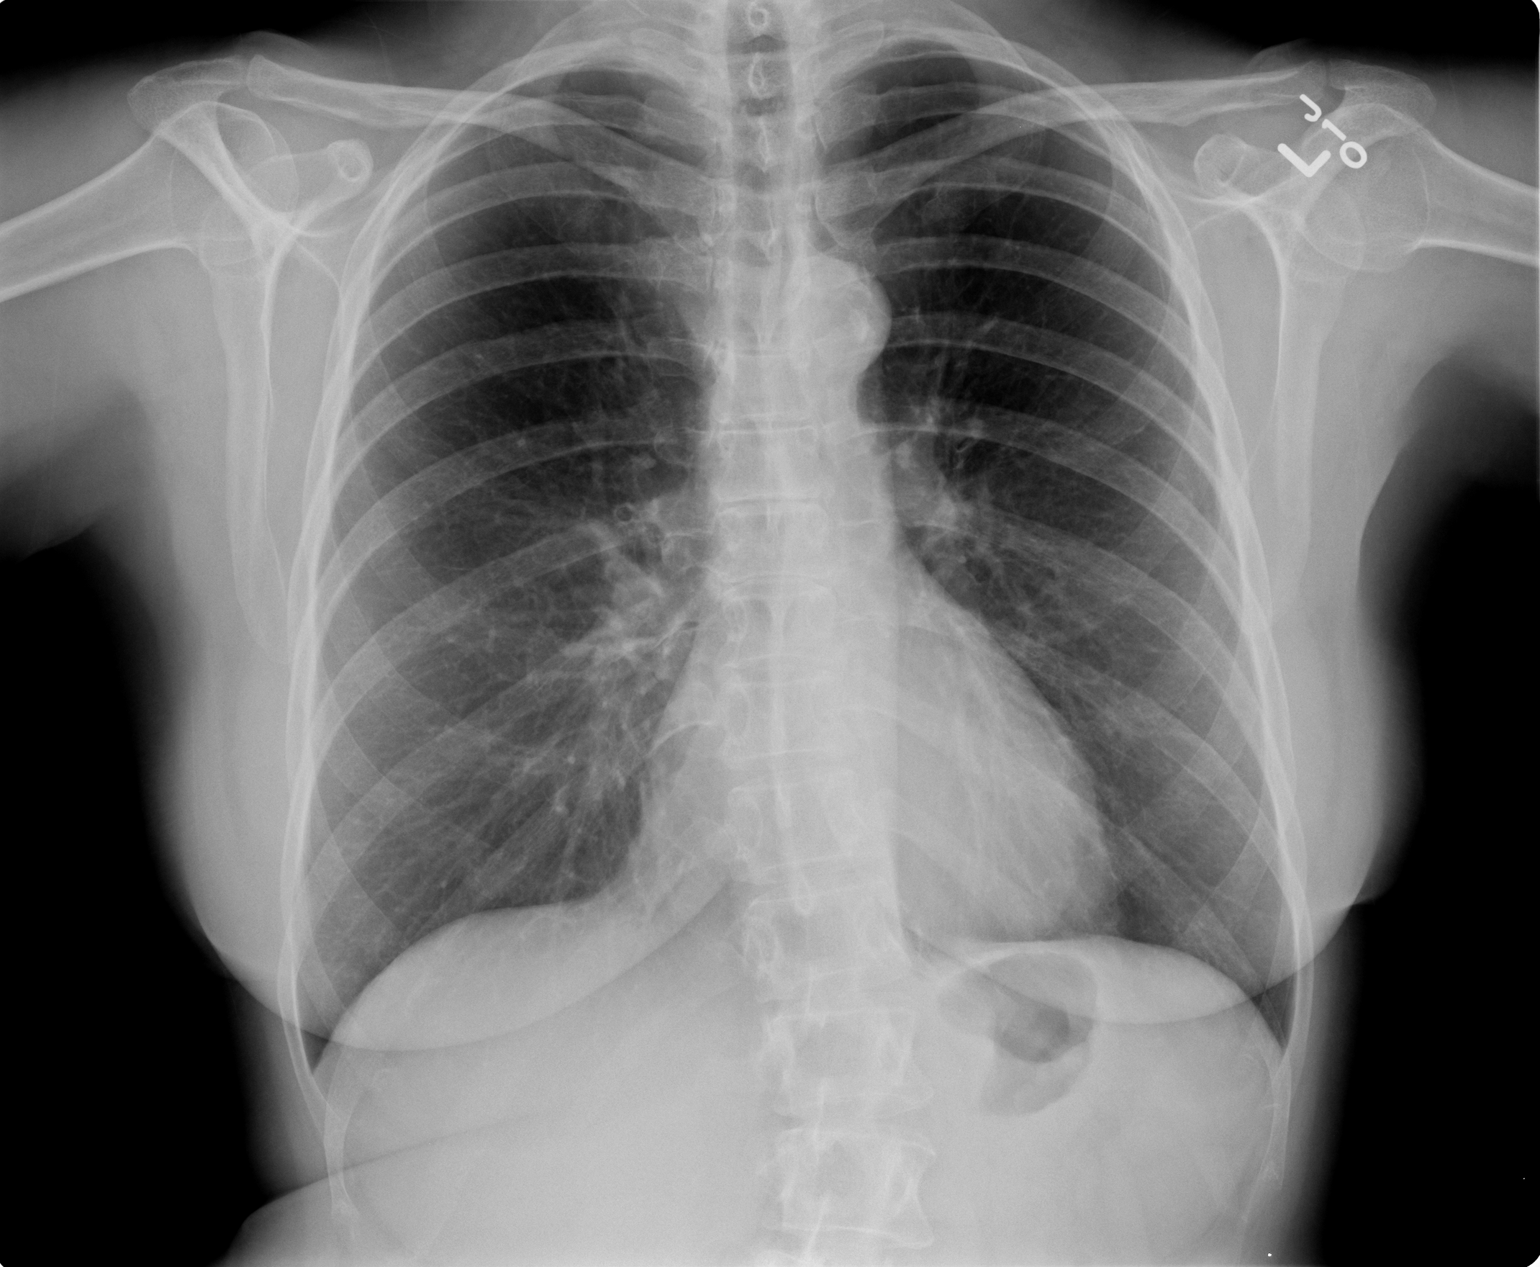

[view not recorded (2 of 2)]
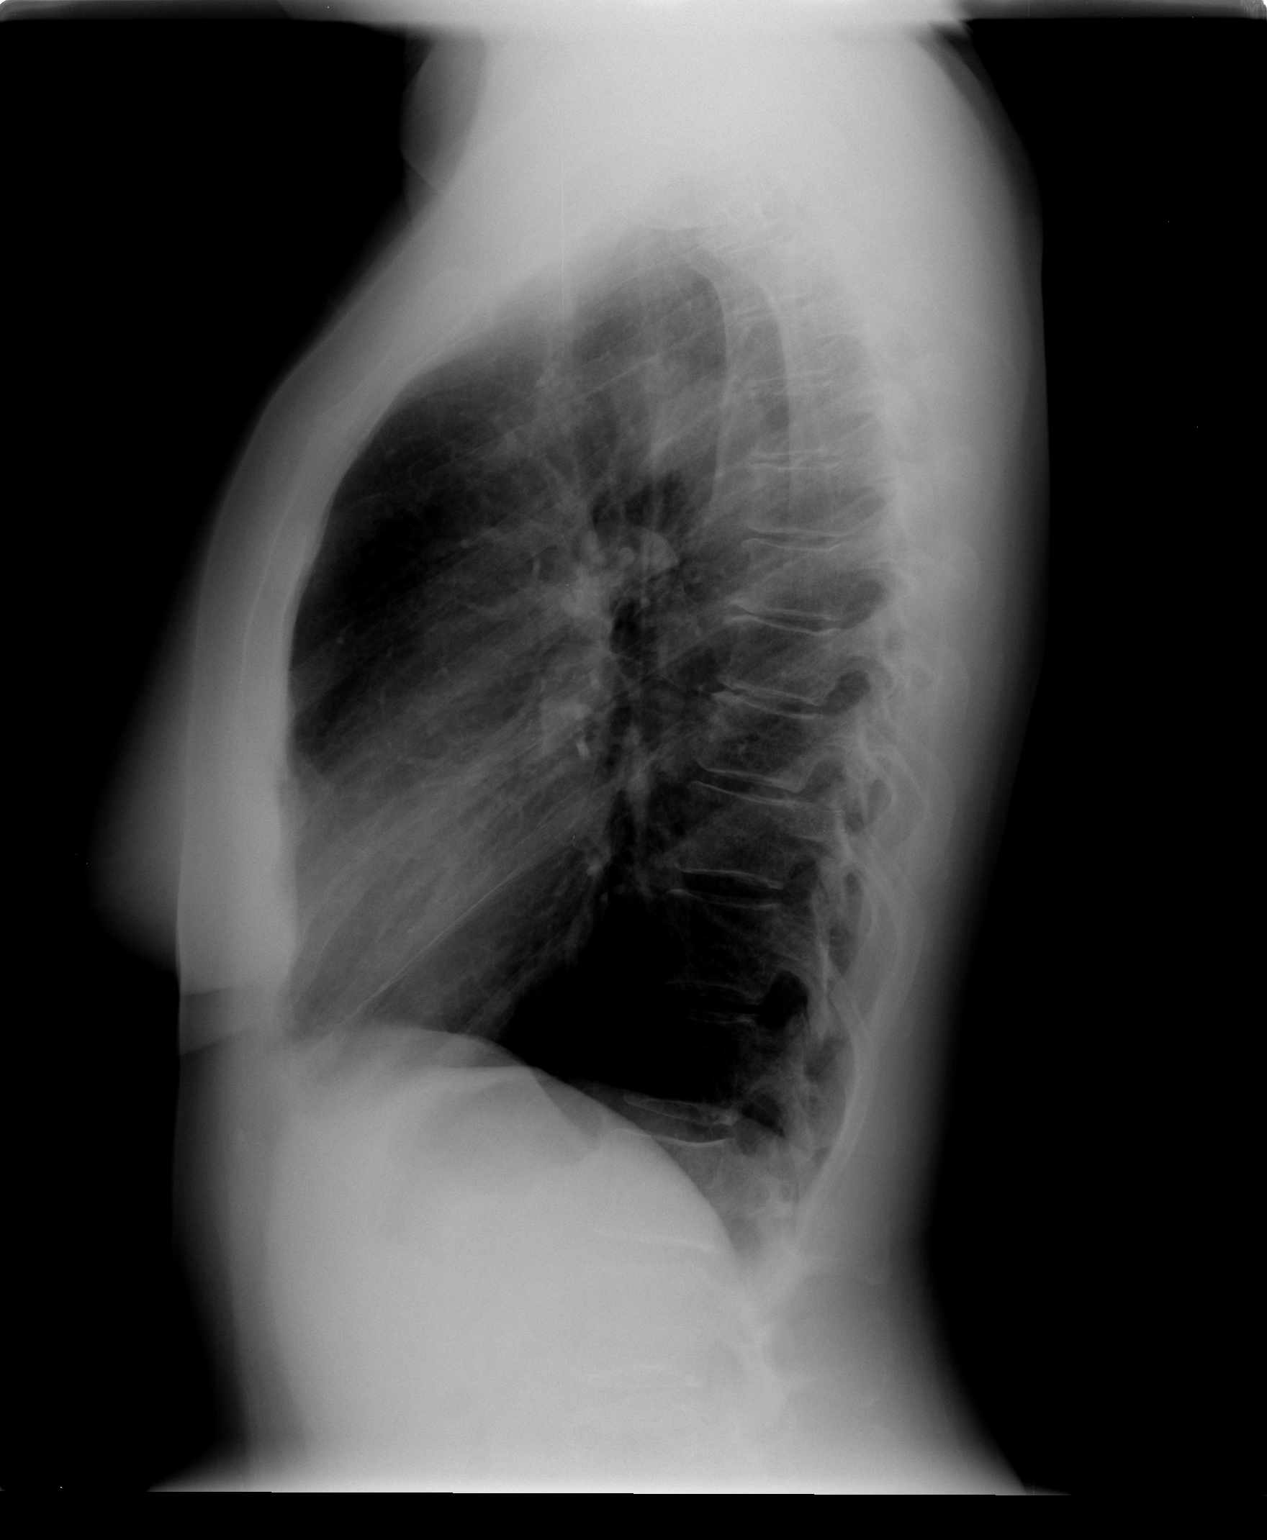

[2 of 2 positions shown; findings below may reference images not displayed]

FINDINGS: Stable lung volumes.  Cardiac size and mediastinal
contours are within normal limits.  No pneumothorax, pulmonary
edema, pleural effusion or confluent pulmonary opacity. Visualized
tracheal air column is within normal limits.  Stable visualized
osseous structures.
IMPRESSION: No acute cardiopulmonary abnormality.

## 2014-07-07 DIAGNOSIS — M5126 Other intervertebral disc displacement, lumbar region: Secondary | ICD-10-CM | POA: Diagnosis not present

## 2014-07-07 DIAGNOSIS — G90522 Complex regional pain syndrome I of left lower limb: Secondary | ICD-10-CM | POA: Diagnosis not present

## 2014-07-14 DIAGNOSIS — Z1231 Encounter for screening mammogram for malignant neoplasm of breast: Secondary | ICD-10-CM | POA: Diagnosis not present

## 2014-10-28 DIAGNOSIS — Z23 Encounter for immunization: Secondary | ICD-10-CM | POA: Diagnosis not present

## 2014-12-02 DIAGNOSIS — G894 Chronic pain syndrome: Secondary | ICD-10-CM | POA: Diagnosis not present

## 2014-12-02 DIAGNOSIS — G90522 Complex regional pain syndrome I of left lower limb: Secondary | ICD-10-CM | POA: Diagnosis not present

## 2014-12-02 DIAGNOSIS — Z79891 Long term (current) use of opiate analgesic: Secondary | ICD-10-CM | POA: Diagnosis not present

## 2014-12-02 DIAGNOSIS — M5136 Other intervertebral disc degeneration, lumbar region: Secondary | ICD-10-CM | POA: Diagnosis not present

## 2014-12-15 DIAGNOSIS — G8929 Other chronic pain: Secondary | ICD-10-CM | POA: Diagnosis not present

## 2014-12-15 DIAGNOSIS — Z96651 Presence of right artificial knee joint: Secondary | ICD-10-CM | POA: Diagnosis not present

## 2014-12-15 DIAGNOSIS — M25561 Pain in right knee: Secondary | ICD-10-CM | POA: Diagnosis not present

## 2014-12-28 DIAGNOSIS — M25561 Pain in right knee: Secondary | ICD-10-CM | POA: Diagnosis not present

## 2014-12-28 DIAGNOSIS — Z789 Other specified health status: Secondary | ICD-10-CM | POA: Diagnosis not present

## 2014-12-29 ENCOUNTER — Other Ambulatory Visit: Payer: Self-pay | Admitting: Orthopedic Surgery

## 2015-01-27 ENCOUNTER — Other Ambulatory Visit (HOSPITAL_COMMUNITY): Payer: Medicare Other

## 2015-02-08 ENCOUNTER — Inpatient Hospital Stay: Admit: 2015-02-08 | Payer: Self-pay | Admitting: Orthopedic Surgery

## 2015-02-08 SURGERY — TOTAL KNEE REVISION
Anesthesia: Spinal | Laterality: Right

## 2015-02-19 ENCOUNTER — Other Ambulatory Visit: Payer: Self-pay | Admitting: Orthopedic Surgery

## 2015-03-04 ENCOUNTER — Ambulatory Visit (HOSPITAL_COMMUNITY)
Admission: RE | Admit: 2015-03-04 | Discharge: 2015-03-04 | Disposition: A | Discharge status: Home / Self Care | Payer: Medicare Other | Source: Ambulatory Visit | Attending: Orthopedic Surgery | Admitting: Orthopedic Surgery

## 2015-03-04 ENCOUNTER — Encounter (HOSPITAL_COMMUNITY): Payer: Self-pay

## 2015-03-04 ENCOUNTER — Encounter (HOSPITAL_COMMUNITY)
Admission: RE | Admit: 2015-03-04 | Discharge: 2015-03-04 | Disposition: A | Discharge status: Home / Self Care | Payer: Medicare Other | Source: Ambulatory Visit | Attending: Orthopedic Surgery | Admitting: Orthopedic Surgery

## 2015-03-04 DIAGNOSIS — Z01812 Encounter for preprocedural laboratory examination: Secondary | ICD-10-CM | POA: Diagnosis not present

## 2015-03-04 DIAGNOSIS — Z79899 Other long term (current) drug therapy: Secondary | ICD-10-CM | POA: Diagnosis not present

## 2015-03-04 DIAGNOSIS — Z01818 Encounter for other preprocedural examination: Secondary | ICD-10-CM | POA: Diagnosis not present

## 2015-03-04 DIAGNOSIS — Z87891 Personal history of nicotine dependence: Secondary | ICD-10-CM | POA: Diagnosis not present

## 2015-03-04 DIAGNOSIS — Z7982 Long term (current) use of aspirin: Secondary | ICD-10-CM | POA: Insufficient documentation

## 2015-03-04 LAB — COMPREHENSIVE METABOLIC PANEL
ALT: 14 U/L (ref 14–54)
AST: 21 U/L (ref 15–41)
Albumin: 4.2 g/dL (ref 3.5–5.0)
Alkaline Phosphatase: 91 U/L (ref 38–126)
Anion gap: 8 (ref 5–15)
BILIRUBIN TOTAL: 0.7 mg/dL (ref 0.3–1.2)
BUN: 18 mg/dL (ref 6–20)
CO2: 28 mmol/L (ref 22–32)
CREATININE: 0.71 mg/dL (ref 0.44–1.00)
Calcium: 9.2 mg/dL (ref 8.9–10.3)
Chloride: 106 mmol/L (ref 101–111)
GFR calc Af Amer: >60 mL/min (ref >60)
Glucose, Bld: 92 mg/dL (ref 65–99)
Potassium: 4.1 mmol/L (ref 3.5–5.1)
Sodium: 142 mmol/L (ref 135–145)
TOTAL PROTEIN: 6.4 g/dL — AB (ref 6.5–8.1)

## 2015-03-04 LAB — CBC WITH DIFFERENTIAL/PLATELET
BASOS ABS: 0 10*3/uL (ref 0.0–0.1)
Basophils Relative: 1 %
Eosinophils Absolute: 0.1 10*3/uL (ref 0.0–0.7)
Eosinophils Relative: 2 %
HEMATOCRIT: 37.9 % (ref 36.0–46.0)
Hemoglobin: 13.2 g/dL (ref 12.0–15.0)
LYMPHS PCT: 31 %
Lymphs Abs: 1.3 10*3/uL (ref 0.7–4.0)
MCH: 32.2 pg (ref 26.0–34.0)
MCHC: 34.8 g/dL (ref 30.0–36.0)
MCV: 92.4 fL (ref 78.0–100.0)
Monocytes Absolute: 0.3 10*3/uL (ref 0.1–1.0)
Monocytes Relative: 8 %
NEUTROS ABS: 2.4 10*3/uL (ref 1.7–7.7)
Neutrophils Relative %: 58 %
Platelets: 126 10*3/uL — ABNORMAL LOW (ref 150–400)
RBC: 4.1 MIL/uL (ref 3.87–5.11)
RDW: 12.4 % (ref 11.5–15.5)
WBC: 4.1 10*3/uL (ref 4.0–10.5)

## 2015-03-04 LAB — TYPE AND SCREEN
ABO/RH(D): B POS
Antibody Screen: NEGATIVE

## 2015-03-04 LAB — PROTIME-INR
INR: 1.02 (ref 0.00–1.49)
Prothrombin Time: 13.6 s (ref 11.6–15.2)

## 2015-03-04 LAB — URINE MICROSCOPIC-ADD ON: WBC, UA: NONE SEEN WBC/hpf (ref 0–5)

## 2015-03-04 LAB — URINALYSIS, ROUTINE W REFLEX MICROSCOPIC
Bilirubin Urine: NEGATIVE
GLUCOSE, UA: NEGATIVE mg/dL
KETONES UR: 15 mg/dL — AB
LEUKOCYTES UA: NEGATIVE
Nitrite: NEGATIVE
PH: 5.5 (ref 5.0–8.0)
Protein, ur: NEGATIVE mg/dL
Specific Gravity, Urine: 1.029 (ref 1.005–1.030)

## 2015-03-04 LAB — SURGICAL PCR SCREEN
MRSA, PCR: NEGATIVE
STAPHYLOCOCCUS AUREUS: NEGATIVE

## 2015-03-04 LAB — GLUCOSE, CAPILLARY: GLUCOSE-CAPILLARY: 142 mg/dL — AB (ref 65–99)

## 2015-03-04 LAB — APTT: APTT: 26 s (ref 24–37)

## 2015-03-04 MED ORDER — CHLORHEXIDINE GLUCONATE 4 % EX LIQD: 60.0000 mL | Freq: Once | CUTANEOUS | Status: DC

## 2015-03-04 MED ORDER — TRANEXAMIC ACID 1000 MG/10ML IV SOLN: 1000.0000 mg | INTRAVENOUS | Status: DC

## 2015-03-04 MED ORDER — CEFAZOLIN SODIUM-DEXTROSE 2-3 GM-% IV SOLR: 2.0000 g | INTRAVENOUS | Status: DC

## 2015-03-04 MED ORDER — SODIUM CHLORIDE 0.9 % IV SOLN: INTRAVENOUS | Status: DC

## 2015-03-04 NOTE — Pre-Procedure Instructions (Signed)
    Angela Burnett  03/04/2015      [id number] - [place], [place] - [address] [place] [address] Phone: [phone] Fax: [phone]    Your procedure is scheduled on 03/15/15.  Report to [facility] Admitting at 745 A.M.  Call this number if you have problems the morning of surgery:  [phone]   Remember:  Do not eat food or drink liquids after midnight.  Take these medicines the morning of surgery with A SIP OF WATER --oxycodone   Do not wear jewelry, make-up or nail polish.  Do not wear lotions, powders, or perfumes.  You may wear deodorant.  Do not shave 48 hours prior to surgery.  Men may shave face and neck.  Do not bring valuables to the hospital.  [facility] is not responsible for any belongings or valuables.  Contacts, dentures or bridgework may not be worn into surgery.  Leave your suitcase in the car.  After surgery it may be brought to your room.  For patients admitted to the hospital, discharge time will be determined by your treatment team.  Patients discharged the day of surgery will not be allowed to drive home.   Name and phone number of your driver:   Special instructions:   Please read over the following fact sheets that you were given. Pain Booklet, Coughing and Deep Breathing, MRSA Information and Surgical Site Infection Prevention

## 2015-03-05 LAB — URINE CULTURE

## 2015-03-08 ENCOUNTER — Encounter (HOSPITAL_COMMUNITY): Payer: Self-pay

## 2015-03-08 NOTE — Progress Notes (Signed)
Anesthesia Chart Review: Patient is a 65 year old female scheduled for right total knee revision on 03/15/15 by Dr. Sherlean Foot.  History includes former smoker, headache, anxiety, shortness of breath, left ankle joint replacement, right TKA '14. Normal coronaries by 2012 cath. Mady Gemma, PA-C medically cleared patient following 12/28/14 office visit.  Meds include aspirin 81 mg, Robaxin, morphine, oxycodone.  12/28/14 EKG (PCP): SR, rSr prime V1, probable normal variant. Non-specific inferior and anterior T wave changes. Low QRS voltages in precordial leads.   02/04/10 Cardiac cath (Dr. Nanetta Batty):  1. Left main normal. 2. LAD normal. 3. left circumflex normal. 4. ramus intermedius branch normal. 5. Right coronary artery is dominant and normal. 6. Overall LVEF was estimated at greater than 60% without focal wall motion abnormalities.  03/04/15 CXR: IMPRESSION: Mild hyperinflation which may be voluntary or could reflect underlying COPD or reactive airway disease. There is no acute cardiopulmonary abnormality.  Preoperative labs noted.  If no acute changes then I anticipate that she can proceed as planned.  Velna Ochs [facility] Short Stay Center/Anesthesiology Phone [phone] 03/08/2015 3:58 PM

## 2015-03-12 MED ORDER — BUPIVACAINE LIPOSOME 1.3 % IJ SUSP
20.0000 mL | Freq: Once | INTRAMUSCULAR | Status: AC
  Administered 2015-03-15: 20 mL
  Filled 2015-03-12: qty 20

## 2015-03-12 MED ORDER — CHLORHEXIDINE GLUCONATE 4 % EX LIQD: 60.0000 mL | Freq: Once | CUTANEOUS | Status: DC

## 2015-03-12 MED ORDER — SODIUM CHLORIDE 0.9 % IV SOLN: INTRAVENOUS | Status: DC

## 2015-03-12 MED ORDER — CEFAZOLIN SODIUM-DEXTROSE 2-3 GM-% IV SOLR
2.0000 g | INTRAVENOUS | Status: AC
  Administered 2015-03-15: 2 g via INTRAVENOUS
  Filled 2015-03-12: qty 50

## 2015-03-12 MED ORDER — SODIUM CHLORIDE 0.9 % IV SOLN
1000.0000 mg | INTRAVENOUS | Status: AC
  Administered 2015-03-15: 1000 mg via INTRAVENOUS
  Filled 2015-03-12: qty 10

## 2015-03-14 NOTE — Anesthesia Preprocedure Evaluation (Addendum)
Anesthesia Evaluation  Patient identified by MRN, date of birth, ID band Patient awake    Reviewed: Allergy & Precautions, H&P , NPO status , Patient's Chart, lab work & pertinent test results  Airway Mallampati: II  TM Distance: >3 FB Neck ROM: Full    Dental no notable dental hx. (+) Teeth Intact, Dental Advisory Given   Pulmonary neg pulmonary ROS, former smoker,    Pulmonary exam normal breath sounds clear to auscultation       Cardiovascular negative cardio ROS   Rhythm:Regular Rate:Normal     Neuro/Psych  Headaches, Anxiety negative psych ROS   GI/Hepatic negative GI ROS, Neg liver ROS,   Endo/Other  negative endocrine ROS  Renal/GU negative Renal ROS  negative genitourinary   Musculoskeletal   Abdominal   Peds  Hematology negative hematology ROS (+)   Anesthesia Other Findings   Reproductive/Obstetrics negative OB ROS                           Anesthesia Physical Anesthesia Plan  ASA: II  Anesthesia Plan: MAC and Spinal   Post-op Pain Management:    Induction: Intravenous  Airway Management Planned: Simple Face Mask  Additional Equipment:   Intra-op Plan:   Post-operative Plan:   Informed Consent: I have reviewed the patients History and Physical, chart, labs and discussed the procedure including the risks, benefits and alternatives for the proposed anesthesia with the patient or authorized representative who has indicated his/her understanding and acceptance.   Dental advisory given  Plan Discussed with: CRNA  Anesthesia Plan Comments:         Anesthesia Quick Evaluation

## 2015-03-15 ENCOUNTER — Encounter (HOSPITAL_COMMUNITY)
Admission: RE | Disposition: A | Discharge status: Home Health | Payer: Self-pay | Source: Ambulatory Visit | Attending: Orthopedic Surgery

## 2015-03-15 ENCOUNTER — Inpatient Hospital Stay (HOSPITAL_COMMUNITY)
Admission: RE | Admit: 2015-03-15 | Discharge: 2015-03-16 | DRG: 467 | Disposition: A | Discharge status: Home Health | Payer: Medicare Other | Source: Ambulatory Visit | Attending: Orthopedic Surgery | Admitting: Orthopedic Surgery

## 2015-03-15 ENCOUNTER — Encounter (HOSPITAL_COMMUNITY): Payer: Self-pay | Admitting: *Deleted

## 2015-03-15 ENCOUNTER — Inpatient Hospital Stay (HOSPITAL_COMMUNITY): Payer: Medicare Other | Admitting: Vascular Surgery

## 2015-03-15 ENCOUNTER — Inpatient Hospital Stay (HOSPITAL_COMMUNITY): Payer: Medicare Other | Admitting: Anesthesiology

## 2015-03-15 DIAGNOSIS — Y792 Prosthetic and other implants, materials and accessory orthopedic devices associated with adverse incidents: Secondary | ICD-10-CM | POA: Diagnosis present

## 2015-03-15 DIAGNOSIS — T84092A Other mechanical complication of internal right knee prosthesis, initial encounter: Secondary | ICD-10-CM | POA: Diagnosis present

## 2015-03-15 DIAGNOSIS — Z96659 Presence of unspecified artificial knee joint: Secondary | ICD-10-CM

## 2015-03-15 DIAGNOSIS — D62 Acute posthemorrhagic anemia: Secondary | ICD-10-CM | POA: Diagnosis not present

## 2015-03-15 DIAGNOSIS — Z87891 Personal history of nicotine dependence: Secondary | ICD-10-CM

## 2015-03-15 DIAGNOSIS — Z7982 Long term (current) use of aspirin: Secondary | ICD-10-CM

## 2015-03-15 DIAGNOSIS — M25561 Pain in right knee: Secondary | ICD-10-CM | POA: Diagnosis not present

## 2015-03-15 DIAGNOSIS — T84099A Other mechanical complication of unspecified internal joint prosthesis, initial encounter: Secondary | ICD-10-CM | POA: Diagnosis not present

## 2015-03-15 DIAGNOSIS — R0602 Shortness of breath: Secondary | ICD-10-CM | POA: Diagnosis not present

## 2015-03-15 DIAGNOSIS — Z96651 Presence of right artificial knee joint: Secondary | ICD-10-CM | POA: Diagnosis not present

## 2015-03-15 DIAGNOSIS — M1711 Unilateral primary osteoarthritis, right knee: Secondary | ICD-10-CM | POA: Diagnosis present

## 2015-03-15 HISTORY — PX: TOTAL KNEE REVISION: SHX996

## 2015-03-15 LAB — CBC
HEMATOCRIT: 31.2 % — AB (ref 36.0–46.0)
Hemoglobin: 10.5 g/dL — ABNORMAL LOW (ref 12.0–15.0)
MCH: 31.3 pg (ref 26.0–34.0)
MCHC: 33.7 g/dL (ref 30.0–36.0)
MCV: 92.9 fL (ref 78.0–100.0)
PLATELETS: 106 10*3/uL — AB (ref 150–400)
RBC: 3.36 MIL/uL — ABNORMAL LOW (ref 3.87–5.11)
RDW: 12.6 % (ref 11.5–15.5)
WBC: 6.3 10*3/uL (ref 4.0–10.5)

## 2015-03-15 LAB — CREATININE, SERUM
Creatinine, Ser: 0.61 mg/dL (ref 0.44–1.00)
GFR calc Af Amer: >60 mL/min (ref >60)
GFR calc non Af Amer: >60 mL/min (ref >60)

## 2015-03-15 SURGERY — TOTAL KNEE REVISION
Anesthesia: Monitor Anesthesia Care | Site: Knee | Laterality: Right

## 2015-03-15 MED ORDER — SODIUM CHLORIDE 0.9 % IV SOLN
1000.0000 mg | Freq: Once | INTRAVENOUS | Status: AC
  Administered 2015-03-15: 1000 mg via INTRAVENOUS
  Filled 2015-03-15: qty 10

## 2015-03-15 MED ORDER — ONDANSETRON HCL 4 MG/2ML IJ SOLN: 4.0000 mg | Freq: Four times a day (QID) | INTRAMUSCULAR | Status: DC | PRN

## 2015-03-15 MED ORDER — METHOCARBAMOL 500 MG PO TABS
ORAL_TABLET | ORAL | Status: AC
  Filled 2015-03-15: qty 1

## 2015-03-15 MED ORDER — BISACODYL 5 MG PO TBEC
5.0000 mg | DELAYED_RELEASE_TABLET | Freq: Every day | ORAL | Status: DC | PRN
Start: 2015-03-15 — End: 2015-03-15

## 2015-03-15 MED ORDER — ACETAMINOPHEN 650 MG RE SUPP: 650.0000 mg | Freq: Four times a day (QID) | RECTAL | Status: DC | PRN

## 2015-03-15 MED ORDER — HYDROMORPHONE HCL 1 MG/ML IJ SOLN
0.5000 mg | INTRAMUSCULAR | Status: DC | PRN
  Administered 2015-03-15 (×2): 0.5 mg via INTRAVENOUS

## 2015-03-15 MED ORDER — ACETAMINOPHEN 325 MG PO TABS: 650.0000 mg | ORAL_TABLET | Freq: Four times a day (QID) | ORAL | Status: DC | PRN

## 2015-03-15 MED ORDER — METHOCARBAMOL 500 MG PO TABS
500.0000 mg | ORAL_TABLET | Freq: Four times a day (QID) | ORAL | Status: DC | PRN
  Administered 2015-03-15: 500 mg via ORAL

## 2015-03-15 MED ORDER — CELECOXIB 200 MG PO CAPS
200.0000 mg | ORAL_CAPSULE | Freq: Two times a day (BID) | ORAL | Status: DC
  Administered 2015-03-15: 200 mg via ORAL
  Filled 2015-03-15 (×2): qty 1

## 2015-03-15 MED ORDER — SODIUM CHLORIDE 0.9 % IV SOLN: INTRAVENOUS | Status: DC

## 2015-03-15 MED ORDER — OXYCODONE HCL 5 MG PO TABS
ORAL_TABLET | ORAL | Status: AC
  Filled 2015-03-15: qty 3

## 2015-03-15 MED ORDER — DIPHENHYDRAMINE HCL 12.5 MG/5ML PO ELIX: 12.5000 mg | ORAL_SOLUTION | ORAL | Status: DC | PRN

## 2015-03-15 MED ORDER — ONDANSETRON HCL 4 MG PO TABS: 4.0000 mg | ORAL_TABLET | Freq: Four times a day (QID) | ORAL | Status: DC | PRN

## 2015-03-15 MED ORDER — HYDROMORPHONE HCL 1 MG/ML IJ SOLN
INTRAMUSCULAR | Status: AC
  Administered 2015-03-15: 0.5 mg via INTRAVENOUS
  Filled 2015-03-15: qty 1

## 2015-03-15 MED ORDER — ALUM & MAG HYDROXIDE-SIMETH 200-200-20 MG/5ML PO SUSP: 30.0000 mL | ORAL | Status: DC | PRN

## 2015-03-15 MED ORDER — SODIUM CHLORIDE 0.9 % IR SOLN
Status: DC | PRN
  Administered 2015-03-15: 1000 mL

## 2015-03-15 MED ORDER — CEFAZOLIN SODIUM 1-5 GM-% IV SOLN: 1.0000 g | Freq: Four times a day (QID) | INTRAVENOUS | Status: DC

## 2015-03-15 MED ORDER — CELECOXIB 200 MG PO CAPS: 200.0000 mg | ORAL_CAPSULE | Freq: Two times a day (BID) | ORAL | Status: DC

## 2015-03-15 MED ORDER — OXYCODONE HCL ER 10 MG PO T12A
10.0000 mg | EXTENDED_RELEASE_TABLET | Freq: Two times a day (BID) | ORAL | Status: DC
  Administered 2015-03-15 – 2015-03-16 (×2): 10 mg via ORAL
  Filled 2015-03-15 (×2): qty 1

## 2015-03-15 MED ORDER — METHOCARBAMOL 1000 MG/10ML IJ SOLN
500.0000 mg | Freq: Four times a day (QID) | INTRAVENOUS | Status: DC | PRN
  Filled 2015-03-15: qty 5

## 2015-03-15 MED ORDER — METOCLOPRAMIDE HCL 5 MG PO TABS: 5.0000 mg | ORAL_TABLET | Freq: Three times a day (TID) | ORAL | Status: DC | PRN

## 2015-03-15 MED ORDER — DOCUSATE SODIUM 100 MG PO CAPS: 100.0000 mg | ORAL_CAPSULE | Freq: Two times a day (BID) | ORAL | Status: DC

## 2015-03-15 MED ORDER — MIDAZOLAM HCL 2 MG/2ML IJ SOLN
INTRAMUSCULAR | Status: AC
  Filled 2015-03-15: qty 2

## 2015-03-15 MED ORDER — ENOXAPARIN SODIUM 30 MG/0.3ML ~~LOC~~ SOLN: 30.0000 mg | Freq: Two times a day (BID) | SUBCUTANEOUS | Status: DC

## 2015-03-15 MED ORDER — HYDROMORPHONE HCL 1 MG/ML IJ SOLN
INTRAMUSCULAR | Status: AC
  Filled 2015-03-15: qty 1

## 2015-03-15 MED ORDER — FLEET ENEMA 7-19 GM/118ML RE ENEM: 1.0000 | ENEMA | Freq: Once | RECTAL | Status: DC | PRN

## 2015-03-15 MED ORDER — MIDAZOLAM HCL 5 MG/5ML IJ SOLN
INTRAMUSCULAR | Status: DC | PRN
  Administered 2015-03-15: 2 mg via INTRAVENOUS

## 2015-03-15 MED ORDER — PROPOFOL 500 MG/50ML IV EMUL
INTRAVENOUS | Status: DC | PRN
  Administered 2015-03-15: 75 ug/kg/min via INTRAVENOUS

## 2015-03-15 MED ORDER — MENTHOL 3 MG MT LOZG: 1.0000 | LOZENGE | OROMUCOSAL | Status: DC | PRN

## 2015-03-15 MED ORDER — SENNOSIDES-DOCUSATE SODIUM 8.6-50 MG PO TABS: 1.0000 | ORAL_TABLET | Freq: Every evening | ORAL | Status: DC | PRN

## 2015-03-15 MED ORDER — CEFAZOLIN SODIUM-DEXTROSE 2-3 GM-% IV SOLR
2.0000 g | Freq: Four times a day (QID) | INTRAVENOUS | Status: AC
  Administered 2015-03-15 – 2015-03-16 (×2): 2 g via INTRAVENOUS
  Filled 2015-03-15 (×2): qty 50

## 2015-03-15 MED ORDER — FENTANYL CITRATE (PF) 250 MCG/5ML IJ SOLN
INTRAMUSCULAR | Status: AC
  Filled 2015-03-15: qty 5

## 2015-03-15 MED ORDER — OXYCODONE HCL ER 10 MG PO T12A: 10.0000 mg | EXTENDED_RELEASE_TABLET | Freq: Two times a day (BID) | ORAL | Status: DC

## 2015-03-15 MED ORDER — DOCUSATE SODIUM 100 MG PO CAPS
100.0000 mg | ORAL_CAPSULE | Freq: Two times a day (BID) | ORAL | Status: DC
  Administered 2015-03-15 – 2015-03-16 (×2): 100 mg via ORAL
  Filled 2015-03-15 (×2): qty 1

## 2015-03-15 MED ORDER — ONDANSETRON HCL 4 MG/2ML IJ SOLN
4.0000 mg | Freq: Four times a day (QID) | INTRAMUSCULAR | Status: DC | PRN
  Administered 2015-03-15: 4 mg via INTRAVENOUS
  Filled 2015-03-15: qty 2

## 2015-03-15 MED ORDER — METHOCARBAMOL 500 MG PO TABS: 500.0000 mg | ORAL_TABLET | Freq: Four times a day (QID) | ORAL | Status: DC | PRN

## 2015-03-15 MED ORDER — GLYCOPYRROLATE 0.2 MG/ML IJ SOLN
INTRAMUSCULAR | Status: DC | PRN
  Administered 2015-03-15 (×2): 0.1 mg via INTRAVENOUS

## 2015-03-15 MED ORDER — OXYCODONE HCL 5 MG PO TABS
ORAL_TABLET | ORAL | Status: AC
  Filled 2015-03-15: qty 2

## 2015-03-15 MED ORDER — BUPIVACAINE IN DEXTROSE 0.75-8.25 % IT SOLN
INTRATHECAL | Status: DC | PRN
  Administered 2015-03-15: 13.5 mg via INTRATHECAL

## 2015-03-15 MED ORDER — HYDROMORPHONE HCL 1 MG/ML IJ SOLN
1.0000 mg | INTRAMUSCULAR | Status: DC | PRN
Start: 2015-03-15 — End: 2015-03-16
  Administered 2015-03-15 – 2015-03-16 (×3): 1 mg via INTRAVENOUS
  Filled 2015-03-15 (×3): qty 1

## 2015-03-15 MED ORDER — BUPIVACAINE HCL (PF) 0.25 % IJ SOLN
INTRAMUSCULAR | Status: AC
  Filled 2015-03-15: qty 30

## 2015-03-15 MED ORDER — METOCLOPRAMIDE HCL 5 MG/ML IJ SOLN
5.0000 mg | Freq: Three times a day (TID) | INTRAMUSCULAR | Status: DC | PRN
Start: 2015-03-15 — End: 2015-03-16

## 2015-03-15 MED ORDER — PHENOL 1.4 % MT LIQD: 1.0000 | OROMUCOSAL | Status: DC | PRN

## 2015-03-15 MED ORDER — ZOLPIDEM TARTRATE 5 MG PO TABS: 5.0000 mg | ORAL_TABLET | Freq: Every evening | ORAL | Status: DC | PRN

## 2015-03-15 MED ORDER — LACTATED RINGERS IV SOLN
INTRAVENOUS | Status: DC | PRN
  Administered 2015-03-15 (×2): via INTRAVENOUS

## 2015-03-15 MED ORDER — METHOCARBAMOL 500 MG PO TABS
ORAL_TABLET | ORAL | Status: AC
  Administered 2015-03-15: 500 mg via ORAL
  Filled 2015-03-15: qty 1

## 2015-03-15 MED ORDER — BUPIVACAINE-EPINEPHRINE 0.25% -1:200000 IJ SOLN
INTRAMUSCULAR | Status: DC | PRN
  Administered 2015-03-15: 30 mL

## 2015-03-15 MED ORDER — PHENYLEPHRINE HCL 10 MG/ML IJ SOLN
INTRAMUSCULAR | Status: DC | PRN
  Administered 2015-03-15: 40 ug via INTRAVENOUS
  Administered 2015-03-15: 80 ug via INTRAVENOUS

## 2015-03-15 MED ORDER — LACTATED RINGERS IV SOLN
INTRAVENOUS | Status: DC
  Administered 2015-03-15: 08:00:00 via INTRAVENOUS

## 2015-03-15 MED ORDER — METOCLOPRAMIDE HCL 5 MG/ML IJ SOLN: 5.0000 mg | Freq: Three times a day (TID) | INTRAMUSCULAR | Status: DC | PRN

## 2015-03-15 MED ORDER — BISACODYL 5 MG PO TBEC: 5.0000 mg | DELAYED_RELEASE_TABLET | Freq: Every day | ORAL | Status: DC | PRN

## 2015-03-15 MED ORDER — SODIUM CHLORIDE 0.9 % IJ SOLN
INTRAMUSCULAR | Status: DC | PRN
  Administered 2015-03-15: 20 mL

## 2015-03-15 MED ORDER — OXYCODONE HCL 5 MG PO TABS
15.0000 mg | ORAL_TABLET | Freq: Once | ORAL | Status: AC
  Administered 2015-03-15: 15 mg via ORAL

## 2015-03-15 MED ORDER — TRANEXAMIC ACID 1000 MG/10ML IV SOLN
1000.0000 mg | Freq: Once | INTRAVENOUS | Status: DC
  Filled 2015-03-15: qty 10

## 2015-03-15 MED ORDER — SODIUM CHLORIDE 0.9 % IV SOLN
INTRAVENOUS | Status: DC
  Administered 2015-03-15: 20:00:00 via INTRAVENOUS

## 2015-03-15 MED ORDER — ENOXAPARIN SODIUM 30 MG/0.3ML ~~LOC~~ SOLN
30.0000 mg | Freq: Two times a day (BID) | SUBCUTANEOUS | Status: DC
  Administered 2015-03-16: 30 mg via SUBCUTANEOUS
  Filled 2015-03-15: qty 0.3

## 2015-03-15 MED ORDER — HYDROMORPHONE HCL 1 MG/ML IJ SOLN
0.2500 mg | INTRAMUSCULAR | Status: DC | PRN
  Administered 2015-03-15: 0.5 mg via INTRAVENOUS
  Administered 2015-03-15: 1 mg via INTRAVENOUS
  Administered 2015-03-15: 0.5 mg via INTRAVENOUS

## 2015-03-15 MED ORDER — OXYCODONE HCL 5 MG PO TABS
5.0000 mg | ORAL_TABLET | ORAL | Status: DC | PRN
  Administered 2015-03-15: 10 mg via ORAL

## 2015-03-15 MED ORDER — OXYCODONE HCL 5 MG PO TABS
5.0000 mg | ORAL_TABLET | ORAL | Status: DC | PRN
  Administered 2015-03-15 – 2015-03-16 (×4): 10 mg via ORAL
  Filled 2015-03-15 (×3): qty 2

## 2015-03-15 SURGICAL SUPPLY — 83 items
ARTI SURFACE ZIMMER (Orthopedic Implant) ×3 IMPLANT
BANDAGE ESMARK 6X9 LF (GAUZE/BANDAGES/DRESSINGS) ×1 IMPLANT
BLADE SAGITTAL 13X1.27X60 (BLADE) ×2 IMPLANT
BLADE SAGITTAL 13X1.27X60MM (BLADE) ×1
BLADE SAW SGTL 13X75X1.27 (BLADE) ×3 IMPLANT
BLADE SAW SGTL 83.5X18.5 (BLADE) ×3 IMPLANT
BLADE SAW SGTL NAR THIN XSHT (BLADE) ×3 IMPLANT
BLADE SURG 10 STRL SS (BLADE) ×18 IMPLANT
BNDG ESMARK 6X9 LF (GAUZE/BANDAGES/DRESSINGS) ×3
BOWL SMART MIX CTS (DISPOSABLE) IMPLANT
CEMENT BONE SIMPLEX SPEEDSET (Cement) ×6 IMPLANT
COVER SURGICAL LIGHT HANDLE (MISCELLANEOUS) ×3 IMPLANT
CUFF TOURNIQUET SINGLE 34IN LL (TOURNIQUET CUFF) IMPLANT
DRAPE EXTREMITY T 121X128X90 (DRAPE) ×3 IMPLANT
DRAPE IMP U-DRAPE 54X76 (DRAPES) ×3 IMPLANT
DRAPE INCISE IOBAN 66X45 STRL (DRAPES) ×6 IMPLANT
DRAPE PROXIMA HALF (DRAPES) ×3 IMPLANT
DRAPE U-SHAPE 47X51 STRL (DRAPES) ×3 IMPLANT
DRSG ADAPTIC 3X8 NADH LF (GAUZE/BANDAGES/DRESSINGS) ×3 IMPLANT
DRSG PAD ABDOMINAL 8X10 ST (GAUZE/BANDAGES/DRESSINGS) ×3 IMPLANT
DURAPREP 26ML APPLICATOR (WOUND CARE) ×6 IMPLANT
ELECT REM PT RETURN 9FT ADLT (ELECTROSURGICAL) ×3
ELECTRODE REM PT RTRN 9FT ADLT (ELECTROSURGICAL) ×1 IMPLANT
EVACUATOR 1/8 PVC DRAIN (DRAIN) ×3 IMPLANT
FACESHIELD WRAPAROUND (MASK) ×3 IMPLANT
FEMORAL COMP ZIMMER KNEE (Orthopedic Implant) ×3 IMPLANT
GAUZE SPONGE 4X4 12PLY STRL (GAUZE/BANDAGES/DRESSINGS) ×3 IMPLANT
GAUZE VASELINE 3X9 (GAUZE/BANDAGES/DRESSINGS) ×3 IMPLANT
GLOVE BIO SURGEON STRL SZ7 (GLOVE) ×9 IMPLANT
GLOVE BIOGEL M 7.0 STRL (GLOVE) ×3 IMPLANT
GLOVE BIOGEL PI IND STRL 7.0 (GLOVE) ×1 IMPLANT
GLOVE BIOGEL PI IND STRL 7.5 (GLOVE) IMPLANT
GLOVE BIOGEL PI IND STRL 8.5 (GLOVE) ×4 IMPLANT
GLOVE BIOGEL PI INDICATOR 7.0 (GLOVE) ×2
GLOVE BIOGEL PI INDICATOR 7.5 (GLOVE)
GLOVE BIOGEL PI INDICATOR 8.5 (GLOVE) ×8
GLOVE BIOGEL PI ORTHO PRO SZ8 (GLOVE) ×2
GLOVE PI ORTHO PRO STRL SZ8 (GLOVE) ×1 IMPLANT
GLOVE SURG ORTHO 8.0 STRL STRW (GLOVE) ×18 IMPLANT
GOWN STRL REUS W/ TWL LRG LVL3 (GOWN DISPOSABLE) ×2 IMPLANT
GOWN STRL REUS W/ TWL XL LVL3 (GOWN DISPOSABLE) ×2 IMPLANT
GOWN STRL REUS W/TWL 2XL LVL3 (GOWN DISPOSABLE) ×3 IMPLANT
GOWN STRL REUS W/TWL LRG LVL3 (GOWN DISPOSABLE) ×4
GOWN STRL REUS W/TWL XL LVL3 (GOWN DISPOSABLE) ×4
HANDPIECE INTERPULSE COAX TIP (DISPOSABLE)
HOOD PEEL AWAY FACE SHEILD DIS (HOOD) ×12 IMPLANT
KIT BASIN OR (CUSTOM PROCEDURE TRAY) ×3 IMPLANT
KIT ROOM TURNOVER OR (KITS) ×3 IMPLANT
MANIFOLD NEPTUNE II (INSTRUMENTS) ×3 IMPLANT
NEEDLE 18GX1X1/2 (RX/OR ONLY) (NEEDLE) IMPLANT
NEEDLE 22X1 1/2 (OR ONLY) (NEEDLE) ×6 IMPLANT
NS IRRIG 1000ML POUR BTL (IV SOLUTION) ×3 IMPLANT
PACK TOTAL JOINT (CUSTOM PROCEDURE TRAY) ×3 IMPLANT
PACK UNIVERSAL I (CUSTOM PROCEDURE TRAY) ×3 IMPLANT
PAD ARMBOARD 7.5X6 YLW CONV (MISCELLANEOUS) ×6 IMPLANT
PADDING CAST COTTON 6X4 STRL (CAST SUPPLIES) ×3 IMPLANT
PLATE TIB 3 66X42NT ZIER (Orthopedic Implant) ×1 IMPLANT
SET HNDPC FAN SPRY TIP SCT (DISPOSABLE) IMPLANT
SPONGE GAUZE 4X4 12PLY STER LF (GAUZE/BANDAGES/DRESSINGS) ×3 IMPLANT
STAPLER VISISTAT 35W (STAPLE) ×3 IMPLANT
STEM ST EXT ZIER 100X145X12X (Stem) ×1 IMPLANT
STEM ST EXT ZIER 100X145X14X (Stem) ×1 IMPLANT
STEM ST EXT ZIMMER (Stem) ×4 IMPLANT
SUCTION FRAZIER HANDLE 10FR (MISCELLANEOUS) ×2
SUCTION TUBE FRAZIER 10FR DISP (MISCELLANEOUS) ×1 IMPLANT
SUT BONE WAX W31G (SUTURE) ×3 IMPLANT
SUT PDS AB 0 CT 36 (SUTURE) IMPLANT
SUT PDS AB 1 CT  36 (SUTURE)
SUT PDS AB 1 CT 36 (SUTURE) IMPLANT
SUT PDS AB 2-0 CT1 27 (SUTURE) IMPLANT
SUT VIC AB 0 CTB1 27 (SUTURE) ×9 IMPLANT
SUT VIC AB 1 CT1 27 (SUTURE) ×6
SUT VIC AB 1 CT1 27XBRD ANBCTR (SUTURE) ×3 IMPLANT
SUT VIC AB 2-0 CTB1 (SUTURE) ×6 IMPLANT
SWAB COLLECTION DEVICE MRSA (MISCELLANEOUS) ×3 IMPLANT
SYR 20CC LL (SYRINGE) ×6 IMPLANT
SYR CONTROL 10ML LL (SYRINGE) ×6 IMPLANT
TIBIA ZIMMER (Orthopedic Implant) ×2 IMPLANT
TOWEL OR 17X24 6PK STRL BLUE (TOWEL DISPOSABLE) ×3 IMPLANT
TOWEL OR 17X26 10 PK STRL BLUE (TOWEL DISPOSABLE) ×3 IMPLANT
TRAY FOLEY CATH 16FRSI W/METER (SET/KITS/TRAYS/PACK) ×3 IMPLANT
TUBE ANAEROBIC SPECIMEN COL (MISCELLANEOUS) IMPLANT
WATER STERILE IRR 1000ML POUR (IV SOLUTION) ×9 IMPLANT

## 2015-03-15 NOTE — Anesthesia Postprocedure Evaluation (Signed)
Anesthesia Post Note  Patient: Angela Burnett  Procedure(s) Performed: Procedure(s) (LRB): RIGHT TOTAL KNEE REVISION (Right)  Patient location during evaluation: PACU Anesthesia Type: Spinal and MAC Level of consciousness: awake and alert Pain management: pain level controlled Vital Signs Assessment: post-procedure vital signs reviewed and stable Respiratory status: spontaneous breathing, respiratory function stable and patient connected to nasal cannula oxygen Cardiovascular status: blood pressure returned to baseline and stable Postop Assessment: spinal receding Anesthetic complications: no    Last Vitals:  Filed Vitals:   03/15/15 1430 03/15/15 1445  BP: 115/63 104/70  Pulse: 59 72  Temp:  37.4 C  Resp: 13 17    Last Pain:  Filed Vitals:   03/15/15 1452  PainSc: 9                  Thanvi Blincoe,W. EDMOND

## 2015-03-15 NOTE — Transfer of Care (Signed)
Immediate Anesthesia Transfer of Care Note  Patient: Angela Burnett  Procedure(s) Performed: Procedure(s): RIGHT TOTAL KNEE REVISION (Right)  Patient Location: PACU  Anesthesia Type:Spinal  Level of Consciousness: awake, alert , oriented and patient cooperative  Airway & Oxygen Therapy: Patient Spontanous Breathing and Patient connected to nasal cannula oxygen  Post-op Assessment: Report given to RN, Post -op Vital signs reviewed and stable and Patient moving all extremities X 4  Post vital signs: Reviewed and stable  Last Vitals:  Filed Vitals:   03/15/15 0753  BP: 110/59  Pulse: 62  Temp: 36.6 C  Resp: 20    Complications: No apparent anesthesia complications

## 2015-03-15 NOTE — Anesthesia Procedure Notes (Signed)
Spinal Patient location during procedure: OR Start time: 03/15/2015 10:20 AM End time: 03/15/2015 10:24 AM Staffing Anesthesiologist: Gaynelle Adu Performed by: anesthesiologist  Preanesthetic Checklist Completed: patient identified, surgical consent, pre-op evaluation, timeout performed, IV checked, risks and benefits discussed and monitors and equipment checked Spinal Block Patient position: sitting Prep: DuraPrep Patient monitoring: cardiac monitor, continuous pulse ox and blood pressure Approach: midline Location: L3-4 Injection technique: single-shot Needle Needle type: Pencan  Needle gauge: 24 G Needle length: 9 cm Assessment Sensory level: T8 Additional Notes Functioning IV was confirmed and monitors were applied. Sterile prep and drape, including hand hygiene and sterile gloves were used. The patient was positioned and the spine was prepped. The skin was anesthetized with lidocaine.  Free flow of clear CSF was obtained prior to injecting local anesthetic into the CSF.  The spinal needle aspirated freely following injection.  The needle was carefully withdrawn.  The patient tolerated the procedure well.

## 2015-03-15 NOTE — H&P (Signed)
Angela Burnett MRN:  [id number] DOB/SEX:  1950/03/26/female  CHIEF COMPLAINT:  Painful right Knee  HISTORY: Patient is a 65 y.o. female presented with a history of pain in the right knee. Onset of symptoms was gradual starting several years ago with gradually worsening course since that time. Prior procedures on the knee include replacement. Patient has been treated conservatively with over-the-counter NSAIDs and activity modification. Patient currently rates pain in the knee at 9 out of 10 with activity. There is pain at night.  PAST MEDICAL HISTORY: Patient Active Problem List   Diagnosis Date Noted  . Pain, knee 03/19/2012   Past Medical History  Diagnosis Date  . Shortness of breath   . Headache(784.0)   . Anxiety    Past Surgical History  Procedure Laterality Date  . Joint replacement      x5 lt ankle  . Knee arthroplasty    . Excisional total knee arthroplasty Right 03/18/2012    Procedure: EXCISIONAL TOTAL KNEE ARTHROPLASTY;  Surgeon: Dannielle Huh, MD;  Location: [facility] OR;  Service: Orthopedics;  Laterality: Right;  right total knee polyexchange   . Ankle reconstruction    . Cardiac catheterization       clean 2012   dr berry     MEDICATIONS:   Prescriptions prior to admission  Medication Sig Dispense Refill Last Dose  . aspirin EC 81 MG tablet Take 81 mg by mouth daily.   Past Month at Unknown  . Menthol, Topical Analgesic, (BIOFREEZE EX) Apply 1 application topically.     . methocarbamol (ROBAXIN) 500 MG tablet Take 1 tablet (500 mg total) by mouth every 6 (six) hours as needed. 60 tablet 1   . morphine (AVINZA) 60 MG 24 hr capsule Take 60 mg by mouth daily.   03/18/2012 at 0430  . oxyCODONE (ROXICODONE) 15 MG immediate release tablet Take 15 mg by mouth every 4 (four) hours as needed for pain.     . celecoxib (CELEBREX) 200 MG capsule Take 1 capsule (200 mg total) by mouth every 12 (twelve) hours. (Patient not taking: Reported on 03/02/2015) 60 capsule 1   . oxyCODONE  (OXY IR/ROXICODONE) 5 MG immediate release tablet Take 1-2 tablets (5-10 mg total) by mouth every 3 (three) hours as needed. (Patient not taking: Reported on 03/02/2015) 90 tablet 0     ALLERGIES:  No Known Allergies  REVIEW OF SYSTEMS:  Pertinent items are noted in HPI.   FAMILY HISTORY:  No family history on file.  SOCIAL HISTORY:   Social History  Substance Use Topics  . Smoking status: Former Smoker    Quit date: 03/11/2009  . Smokeless tobacco: Not on file  . Alcohol Use: Yes     Comment: occ     EXAMINATION:  Vital signs in last 24 hours:    General appearance: alert, cooperative and no distress Lungs: clear to auscultation bilaterally Heart: regular rate and rhythm, S1, S2 normal, no murmur, click, rub or gallop Abdomen: soft, non-tender; bowel sounds normal; no masses,  no organomegaly Extremities: extremities normal, atraumatic, no cyanosis or edema Pulses: 2+ and symmetric Neurologic: Grossly normal  Musculoskeletal:  ROM 0-105, Ligaments intact,  Imaging Review Plain radiographs demonstrate failed tka degenerative joint disease of the right knee. The overall alignment is neutral. The bone quality appears to be good for age and reported activity level.  Assessment/Plan: Primary osteoarthritis, failed tka knee   The patient history, physical examination and imaging studies are consistent with failed tka degenerative joint disease  of the right knee. The patient has failed conservative treatment.  The clearance notes were reviewed.  After discussion with the patient it was felt that Total Knee Replacement was indicated. The procedure,  risks, and benefits of total knee arthroplasty were presented and reviewed. The risks including but not limited to aseptic loosening, infection, blood clots, vascular injury, stiffness, patella tracking problems complications among others were discussed. The patient acknowledged the explanation, agreed to proceed with the plan.  Guy Sandifer 03/15/2015, 7:07 AM

## 2015-03-16 ENCOUNTER — Encounter (HOSPITAL_COMMUNITY): Payer: Self-pay | Admitting: Orthopedic Surgery

## 2015-03-16 LAB — CBC
HCT: 26.3 % — ABNORMAL LOW (ref 36.0–46.0)
Hemoglobin: 8.8 g/dL — ABNORMAL LOW (ref 12.0–15.0)
MCH: 31.2 pg (ref 26.0–34.0)
MCHC: 33.5 g/dL (ref 30.0–36.0)
MCV: 93.3 fL (ref 78.0–100.0)
PLATELETS: 93 10*3/uL — AB (ref 150–400)
RBC: 2.82 MIL/uL — ABNORMAL LOW (ref 3.87–5.11)
RDW: 12.6 % (ref 11.5–15.5)
WBC: 4.5 10*3/uL (ref 4.0–10.5)

## 2015-03-16 LAB — BASIC METABOLIC PANEL
Anion gap: 7 (ref 5–15)
BUN: 11 mg/dL (ref 6–20)
CALCIUM: 8.3 mg/dL — AB (ref 8.9–10.3)
CHLORIDE: 107 mmol/L (ref 101–111)
CO2: 27 mmol/L (ref 22–32)
CREATININE: 0.73 mg/dL (ref 0.44–1.00)
GFR calc non Af Amer: >60 mL/min (ref >60)
Glucose, Bld: 96 mg/dL (ref 65–99)
Potassium: 3.2 mmol/L — ABNORMAL LOW (ref 3.5–5.1)
SODIUM: 141 mmol/L (ref 135–145)

## 2015-03-16 MED ORDER — SUCCINYLCHOLINE CHLORIDE 20 MG/ML IJ SOLN
INTRAMUSCULAR | Status: AC
  Filled 2015-03-16: qty 1

## 2015-03-16 MED ORDER — ATROPINE SULFATE 0.4 MG/ML IJ SOLN
INTRAMUSCULAR | Status: AC
  Filled 2015-03-16: qty 1

## 2015-03-16 MED ORDER — ENOXAPARIN SODIUM 40 MG/0.4ML ~~LOC~~ SOLN: 40.0000 mg | SUBCUTANEOUS | Status: DC

## 2015-03-16 MED ORDER — CELECOXIB 200 MG PO CAPS: 200.0000 mg | ORAL_CAPSULE | Freq: Two times a day (BID) | ORAL | Status: DC

## 2015-03-16 MED ORDER — ONDANSETRON HCL 4 MG PO TABS: 4.0000 mg | ORAL_TABLET | Freq: Four times a day (QID) | ORAL | Status: DC | PRN

## 2015-03-16 MED ORDER — MORPHINE SULFATE (PF) 2 MG/ML IV SOLN
INTRAVENOUS | Status: AC
  Filled 2015-03-16: qty 1

## 2015-03-16 MED ORDER — OXYCODONE HCL 5 MG PO TABS: 5.0000 mg | ORAL_TABLET | ORAL | Status: DC | PRN

## 2015-03-16 MED ORDER — OXYCODONE HCL ER 10 MG PO T12A: 10.0000 mg | EXTENDED_RELEASE_TABLET | Freq: Two times a day (BID) | ORAL | Status: DC

## 2015-03-16 MED ORDER — METHOCARBAMOL 500 MG PO TABS: 500.0000 mg | ORAL_TABLET | Freq: Four times a day (QID) | ORAL | Status: AC | PRN

## 2015-03-16 MED ORDER — ONDANSETRON HCL 4 MG/2ML IJ SOLN
INTRAMUSCULAR | Status: AC
  Filled 2015-03-16: qty 2

## 2015-03-16 MED ORDER — DEXAMETHASONE SODIUM PHOSPHATE 10 MG/ML IJ SOLN
INTRAMUSCULAR | Status: AC
  Filled 2015-03-16: qty 1

## 2015-03-16 MED ORDER — PROPOFOL 10 MG/ML IV BOLUS
INTRAVENOUS | Status: AC
  Filled 2015-03-16: qty 40

## 2015-03-16 NOTE — Progress Notes (Signed)
Orthopedic Tech Progress Note Patient Details:  Angela Burnett 02-16-1950 [id number]  Ortho Devices Type of Ortho Device: Knee Immobilizer Ortho Device/Splint Location: Bone foam Ortho Device/Splint Interventions: Application   Saul Fordyce 03/16/2015, 9:58 AM

## 2015-03-16 NOTE — Op Note (Signed)
NAME:  Angela Burnett, Angela Burnett NO.:  [id number]  MEDICAL RECORD NO.:  [id number]  LOCATION:  5N23C                        FACILITY:  [facility]  PHYSICIAN:  Mila Homer. Sherlean Foot, M.D. DATE OF BIRTH:  Jan 20, 1951  DATE OF PROCEDURE:  03/15/2015 DATE OF DISCHARGE:  03/16/2015                              OPERATIVE REPORT   SURGEON:  Mila Homer. Sherlean Foot, M.D.  ASSISTANT:  Laurier Nancy, PA-C  ANESTHESIA:  Spinal.  PREOPERATIVE DIAGNOSIS:  Failed right total knee arthroplasty.  POSTOPERATIVE DIAGNOSIS:  Failed right total knee arthroplasty.  PROCEDURE:  Revision, right total knee replacement.  INDICATIONS FOR PROCEDURE:  The patient is a 65 year old white female, just over a year status post total knee replacement, but less was chronic pain, flexion contracture and valgus alignment and arthrofibrosis.  Informed consent obtained.  DESCRIPTION OF PROCEDURE:  The patient was laid supine, administered general anesthesia.  The right leg was prepped and draped in usual fashion.  After sterile prep and drape, the extremity was exsanguinated with the Esmarch and tourniquet inflated to 300 mmHg and set for 1 hour. I used #10 blade to make the old incision anteriorly.  We then used a new 10-blade to perform medial parapatellar arthrotomy.  Preoperative range of motion was 10 degrees to approximately 85 degrees.  I then performed a synovectomy in all scar tissue and immediately was able to get to about 100 degrees of flexion.  I subluxed the patella laterally. At this point, I then turned my attention to the femoral component, where I used the mercury salt to create an interface between the metal and the cement.  I then removed the femoral component easily.  I removed all the loose cement by creating pieces with a straight 0.25-inch osteotome and a mallet.  I then used the mercury salt on the tibia and easily debonded that.  I removed the cement on the tibial side in similar fashion.  I  then recreated my femur with a size D femoral component, reamed up to 14 mm with a 120-mm stem.  I did not use any augments.  I did cut for the box to receive a CCK poly.  On the tibial side, I used a size 3 tray, drilling keel and also reamed up to size 12 stem on that side.  I then trialed with the size D femur, 14 stem, size 3 tibia with a 14 stem and a 14-mm polyethylene CCK insert.  This afforded excellent stability and patellar tracking.  I then copiously irrigated with the trial components out.  I mixed Simplex cement and cemented all the components in once and snapped in the real polyethylene.  This afforded excellent stability and excellent range of motion 0 to about 115.  I then let the tourniquet down, obtained hemostasis.  I infiltrated a 0.25% Marcaine, Exparel, saline periarticular injection in multiple spots in the knee.  I then placed #1 Vicryl sutures in the arthrotomy, 0 Vicryl suture, subcuticular 2-0 Vicryl sutures.  I did leave a median Hemovac deep to the arthrotomy coming out superolaterally.  I then placed Steri-Strips.  Dressed with Xeroform, dressed with sponges, sterile Webril, Ace wrap.  COMPLICATIONS:  None.  DRAINS:  None.  ______________________________ Mila Homer Sherlean Foot, M.D.     SDL/MEDQ  D:  03/16/2015  T:  03/16/2015  Job:  [id number]

## 2015-03-16 NOTE — Progress Notes (Signed)
PT Cancellation/Discharge Note  Patient Details Name: Angela Burnett MRN: [id number] DOB: [birth year]   Cancelled Treatment:    Reason Eval/Treat Not Completed: Patient declined, no reason specified;PT screened, no needs identified, will sign off. Patient reports she is ambulating in room with RW on her own.  This is her 3rd surgery on her knee, and she states she "knows what to do".  Patient reports she has all equipment needed.  And is scheduled with OP PT following d/c.  PT will sign off per patient request.   Vena Austria 03/16/2015, 10:22 AM Durenda Hurt. Renaldo Fiddler, [facility] Acute Rehab Services Pager [phone]

## 2015-03-16 NOTE — Progress Notes (Signed)
SPORTS MEDICINE AND JOINT REPLACEMENT  Georgena Spurling, MD   Altamese Cabal, PA-C [address] [place], [place], [place]  [address]                             [phone]   PROGRESS NOTE  Subjective:  negative for Chest Pain  negative for Shortness of Breath  negative for Nausea/Vomiting   negative for Calf Pain  negative for Bowel Movement   Tolerating Diet: yes         Patient reports pain as 5 on 0-10 scale.    Objective: Vital signs in last 24 hours:   Patient Vitals for the past 24 hrs:  BP Temp Temp src Pulse Resp SpO2 Height Weight  03/16/15 0500 (!) 95/46 mmHg 98.5 F (36.9 C) - 85 17 91 % - -  03/15/15 2108 96/74 mmHg 98.1 F (36.7 C) Oral 80 18 96 % - -  03/15/15 1900 (!) 112/52 mmHg - - 85 16 95 % - -  03/15/15 1836 - 97.9 F (36.6 C) - - - - - -  03/15/15 1815 - - - - - 100 % - -  03/15/15 1800 - - - - - 100 % - -  03/15/15 1700 118/77 mmHg - - 99 - 100 % - -  03/15/15 1601 120/77 mmHg - - (!) 101 - 100 % - -  03/15/15 1501 114/76 mmHg - - (!) 108 - 100 % - -  03/15/15 1445 104/70 mmHg 99.3 F (37.4 C) - 72 17 100 % - -  03/15/15 1430 115/63 mmHg - - (!) 59 13 100 % - -  03/15/15 1415 110/72 mmHg - - (!) 55 10 100 % - -  03/15/15 1400 112/68 mmHg - - (!) 59 10 100 % - -  03/15/15 1345 117/63 mmHg - - 66 15 99 % - -  03/15/15 1330 - - - 68 16 100 % - -  03/15/15 1315 109/76 mmHg 98.2 F (36.8 C) - 66 16 100 % - -  03/15/15 0753 (!) 110/59 mmHg 97.9 F (36.6 C) Oral 62 20 96 % - 59.421 kg (131 lb)  03/15/15 0733 - - - - - -  (1.6 m) 59.784 kg (131 lb 12.8 oz)    {1959:LAST@   Intake/Output from previous day:   02/20 0701 - 02/21 0700 In: 1925 [I.V.:1925] Out: 775 [Urine:300; Drains:175]   Intake/Output this shift:       Intake/Output      02/20 0701 - 02/21 0700 02/21 0701 - 02/22 0700   I.V. (mL/kg) 1925 (32.4)    Total Intake(mL/kg) 1925 (32.4)    Urine (mL/kg/hr) 300 (0.2)    Drains 175 (0.1)    Blood 300 (0.2)    Total Output 775      Net +1150          Urine Occurrence 3 x       LABORATORY DATA:  Recent Labs  03/15/15 1417  WBC 6.3  HGB 10.5*  HCT 31.2*  PLT 106*    Recent Labs  03/15/15 1417  CREATININE 0.61   Lab Results  Component Value Date   INR 1.02 03/04/2015   INR 1.04 03/11/2012   INR 0.99 06/28/2010    Examination:  General appearance: alert, cooperative and no distress Extremities: extremities normal, atraumatic, no cyanosis or edema and no edema, redness or tenderness in the calves or thighs  Wound  Exam: clean, dry, intact   Drainage:  None: wound tissue dry  Motor Exam: Quadriceps and Hamstrings Intact  Sensory Exam: Superficial Peroneal and Tibial normal   Assessment:    1 Day Post-Op  Procedure(s) (LRB): RIGHT TOTAL KNEE REVISION (Right)  ADDITIONAL DIAGNOSIS:  Active Problems:   S/P revision of total knee  Acute Blood Loss Anemia   Plan: Physical Therapy as ordered Weight Bearing as Tolerated (WBAT)  DVT Prophylaxis:  Lovenox  DISCHARGE PLAN: Home  DISCHARGE NEEDS: HHPT         Guy Sandifer 03/16/2015, 7:12 AM

## 2015-03-16 NOTE — Op Note (Signed)
PATIENT ID:      PHUONG HILLARY  MRN:     [id number] DOB/AGE:    02/25/1950 / 65 y.o.                                             _____________________________           Ailene Ards ASSISTANT NOTE        I assisted Surgeon:  Georgena Spurling, MD   ON THE PROCEDURE:  Procedure(s): RIGHT TOTAL KNEE REVISION on 03/15/2015    I provided my assistance on this case for assistance with exposure, retraction, bleeding    control, instrumentation, and closure         Electronicallly signed by SURGERY ASSISTING PHYSICIAN ASSISTANT Guy Sandifer 03/16/2015  7:11 AM

## 2015-03-16 NOTE — Op Note (Signed)
Dictation Number:  [id number]

## 2015-03-16 NOTE — Progress Notes (Signed)
OT Cancellation Note  Patient Details Name: Angela Burnett MRN: [id number] DOB: 02/17/1950   Cancelled Treatment:    Reason Eval/Treat Not Completed: Patient declined, no reason specified OT screened, no needs identified, will sign off. Pt reported that this is her 3rd surgery on her knee, and she states she "knows what to do". Pt has already been ambulating in room with RW on her own and pt reports that she has all equipment needed.Pt scheduled with OP PT following d/c. OT will sign off per patient request.  Nils Pyle, OTR/L Pager: [phone] 03/16/2015, 10:44 AM

## 2015-03-16 NOTE — Progress Notes (Signed)
Pt ready for d/c home per MD. Pt declined therapy because this is her 3rd surgery and stated she already "knows everything";she is ambulating with walker without any difficulty. Discharge teaching and prescriptions reviewed with pt, denied any questions. She has necessary equipment at home.   Lowella Dell  03/16/2015

## 2015-03-16 NOTE — Care Management Note (Signed)
Case Management Note  Patient Details  Name: Angela Burnett MRN: [id number] Date of Birth: 1950/10/11  Subjective/Objective:     S/p right total knee arthroplasty               Action/Plan: [facility] up with [facility] for HHPT by MD office.Spoke with patient, no change in discharge plan. [employer] has delivered CPM to home, patient has rolling walker from previous surgery, declined 3N1. Patient stated that her husband will be able to assist her after discharge.  Expected Discharge Date:                  Expected Discharge Plan:  Home w Home Health Services  In-House Referral:  NA  Discharge planning Services  CM Consult  Post Acute Care Choice:  Durable Medical Equipment, Home Health Choice offered to:  Patient  DME Arranged:  CPM DME Agency:  [employer]  HH Arranged:  PT HH Agency:  [facility]  Status of Service:  Completed, signed off  Medicare Important Message Given:    Date Medicare IM Given:    Medicare IM give by:    Date Additional Medicare IM Given:    Additional Medicare Important Message give by:     If discussed at Long Length of Stay Meetings, dates discussed:    Additional Comments:  Monica Becton, RN 03/16/2015, 10:29 AM

## 2015-03-16 NOTE — Discharge Instructions (Signed)

## 2015-03-17 DIAGNOSIS — Z4733 Aftercare following explantation of knee joint prosthesis: Secondary | ICD-10-CM | POA: Diagnosis not present

## 2015-03-17 DIAGNOSIS — Z87891 Personal history of nicotine dependence: Secondary | ICD-10-CM | POA: Diagnosis not present

## 2015-03-17 DIAGNOSIS — F419 Anxiety disorder, unspecified: Secondary | ICD-10-CM | POA: Diagnosis not present

## 2015-03-17 DIAGNOSIS — Z96662 Presence of left artificial ankle joint: Secondary | ICD-10-CM | POA: Diagnosis not present

## 2015-03-17 DIAGNOSIS — Z96651 Presence of right artificial knee joint: Secondary | ICD-10-CM | POA: Diagnosis not present

## 2015-03-18 DIAGNOSIS — Z96662 Presence of left artificial ankle joint: Secondary | ICD-10-CM | POA: Diagnosis not present

## 2015-03-18 DIAGNOSIS — Z4733 Aftercare following explantation of knee joint prosthesis: Secondary | ICD-10-CM | POA: Diagnosis not present

## 2015-03-18 DIAGNOSIS — Z87891 Personal history of nicotine dependence: Secondary | ICD-10-CM | POA: Diagnosis not present

## 2015-03-18 DIAGNOSIS — F419 Anxiety disorder, unspecified: Secondary | ICD-10-CM | POA: Diagnosis not present

## 2015-03-18 DIAGNOSIS — Z96651 Presence of right artificial knee joint: Secondary | ICD-10-CM | POA: Diagnosis not present

## 2015-03-19 DIAGNOSIS — Z4733 Aftercare following explantation of knee joint prosthesis: Secondary | ICD-10-CM | POA: Diagnosis not present

## 2015-03-19 DIAGNOSIS — Z87891 Personal history of nicotine dependence: Secondary | ICD-10-CM | POA: Diagnosis not present

## 2015-03-19 DIAGNOSIS — F419 Anxiety disorder, unspecified: Secondary | ICD-10-CM | POA: Diagnosis not present

## 2015-03-19 DIAGNOSIS — Z96662 Presence of left artificial ankle joint: Secondary | ICD-10-CM | POA: Diagnosis not present

## 2015-03-19 DIAGNOSIS — Z96651 Presence of right artificial knee joint: Secondary | ICD-10-CM | POA: Diagnosis not present

## 2015-03-20 DIAGNOSIS — Z4733 Aftercare following explantation of knee joint prosthesis: Secondary | ICD-10-CM | POA: Diagnosis not present

## 2015-03-20 DIAGNOSIS — Z96662 Presence of left artificial ankle joint: Secondary | ICD-10-CM | POA: Diagnosis not present

## 2015-03-20 DIAGNOSIS — Z96651 Presence of right artificial knee joint: Secondary | ICD-10-CM | POA: Diagnosis not present

## 2015-03-20 DIAGNOSIS — Z87891 Personal history of nicotine dependence: Secondary | ICD-10-CM | POA: Diagnosis not present

## 2015-03-20 DIAGNOSIS — F419 Anxiety disorder, unspecified: Secondary | ICD-10-CM | POA: Diagnosis not present

## 2015-03-22 DIAGNOSIS — F419 Anxiety disorder, unspecified: Secondary | ICD-10-CM | POA: Diagnosis not present

## 2015-03-22 DIAGNOSIS — Z87891 Personal history of nicotine dependence: Secondary | ICD-10-CM | POA: Diagnosis not present

## 2015-03-22 DIAGNOSIS — Z96651 Presence of right artificial knee joint: Secondary | ICD-10-CM | POA: Diagnosis not present

## 2015-03-22 DIAGNOSIS — Z96662 Presence of left artificial ankle joint: Secondary | ICD-10-CM | POA: Diagnosis not present

## 2015-03-22 DIAGNOSIS — Z4733 Aftercare following explantation of knee joint prosthesis: Secondary | ICD-10-CM | POA: Diagnosis not present

## 2015-03-23 DIAGNOSIS — F419 Anxiety disorder, unspecified: Secondary | ICD-10-CM | POA: Diagnosis not present

## 2015-03-23 DIAGNOSIS — Z87891 Personal history of nicotine dependence: Secondary | ICD-10-CM | POA: Diagnosis not present

## 2015-03-23 DIAGNOSIS — Z4733 Aftercare following explantation of knee joint prosthesis: Secondary | ICD-10-CM | POA: Diagnosis not present

## 2015-03-23 DIAGNOSIS — Z96662 Presence of left artificial ankle joint: Secondary | ICD-10-CM | POA: Diagnosis not present

## 2015-03-23 DIAGNOSIS — Z96651 Presence of right artificial knee joint: Secondary | ICD-10-CM | POA: Diagnosis not present

## 2015-03-24 ENCOUNTER — Encounter (HOSPITAL_COMMUNITY): Payer: Self-pay | Admitting: Orthopedic Surgery

## 2015-03-24 NOTE — Discharge Summary (Signed)
SPORTS MEDICINE & JOINT REPLACEMENT   Georgena Spurling, MD   Altamese Cabal, PA-C Laurier Nancy, PA-C [address] [place], [place], [place]  [address]                             [phone]  PATIENT ID: Angela Burnett        MRN:  [id number]          DOB/AGE: 10/22/1950 / 65 y.o.    DISCHARGE SUMMARY  ADMISSION DATE:    03/15/2015 DISCHARGE DATE:   03/24/2015   ADMISSION DIAGNOSIS: FAILED RIGHT TOTAL KNEE ARTHROPLASTY    DISCHARGE DIAGNOSIS:  FAILED RIGHT TOTAL KNEE ARTHROPLASTY    ADDITIONAL DIAGNOSIS: Active Problems:   S/P revision of total knee  Past Medical History  Diagnosis Date  . Shortness of breath   . Headache(784.0)   . Anxiety     PROCEDURE: Procedure(s): RIGHT TOTAL KNEE REVISION on 03/15/2015  CONSULTS:     HISTORY:  See H&P in chart  HOSPITAL COURSE:  Angela Burnett is a 65 y.o. admitted on 03/15/2015 and found to have a diagnosis of FAILED RIGHT TOTAL KNEE ARTHROPLASTY.  After appropriate laboratory studies were obtained  they were taken to the operating room on 03/15/2015 and underwent Procedure(s): RIGHT TOTAL KNEE REVISION.   They were given perioperative antibiotics:  Anti-infectives    Start     Dose/Rate Route Frequency Ordered Stop   03/15/15 1930  ceFAZolin (ANCEF) IVPB 2 g/50 mL premix     2 g 100 mL/hr over 30 Minutes Intravenous Every 6 hours 03/15/15 1902 03/16/15 0106   03/15/15 1345  ceFAZolin (ANCEF) IVPB 1 g/50 mL premix  Status:  Discontinued     1 g 100 mL/hr over 30 Minutes Intravenous Every 6 hours 03/15/15 1330 03/15/15 1844   03/15/15 0930  ceFAZolin (ANCEF) IVPB 2 g/50 mL premix     2 g 100 mL/hr over 30 Minutes Intravenous To [facility] 03/12/15 1009 03/15/15 1028    .  Patient given tranexamic acid IV or topical and exparel intra-operatively.  Tolerated the procedure well.    POD# 1: Vital signs were stable.  Patient denied Chest pain, shortness of breath, or calf pain.  Patient was started on Lovenox 30 mg  subcutaneously twice daily at 8am.  Consults to PT, OT, and care management were made.  The patient was weight bearing as tolerated.  CPM was placed on the operative leg 0-90 degrees for 6-8 hours a day. When out of the CPM, patient was placed in the foam block to achieve full extension. Incentive spirometry was taught.  Dressing was changed.       POD #2, Continued  PT for ambulation and exercise program.  IV saline locked.  O2 discontinued.    The remainder of the hospital course was dedicated to ambulation and strengthening.   The patient was discharged on  in  Good condition.03/16/15 Blood products given:none  DIAGNOSTIC STUDIES: Recent vital signs: No data found.      Recent laboratory studies: No results for input(s): WBC, HGB, HCT, PLT in the last 168 hours. No results for input(s): NA, K, CL, CO2, BUN, CREATININE, GLUCOSE, CALCIUM in the last 168 hours. Lab Results  Component Value Date   INR 1.02 03/04/2015   INR 1.04 03/11/2012   INR 0.99 06/28/2010     Recent Radiographic Studies :  Dg Chest 2 View  03/04/2015  CLINICAL DATA:  Preoperative exam prior to knee surgery; former smoker. EXAM: CHEST  2 VIEW COMPARISON:  PA and lateral chest x-ray of March 11, 2012 FINDINGS: The lungs are mildly hyperinflated but clear. The heart and pulmonary vascularity are normal. The mediastinum is normal in width. There is no pleural effusion. There is mild multilevel degenerative disc disease of the thoracic spine. IMPRESSION: Mild hyperinflation which may be voluntary or could reflect underlying COPD or reactive airway disease. There is no acute cardiopulmonary abnormality. Electronically Signed   By: David  [place] M.D.   On: 03/04/2015 10:08    DISCHARGE INSTRUCTIONS:   DISCHARGE MEDICATIONS:     Medication List    STOP taking these medications        BIOFREEZE EX     morphine 60 MG 24 hr capsule  Commonly known as:  AVINZA     oxyCODONE 15 MG immediate release tablet  Commonly  known as:  ROXICODONE  Replaced by:  oxyCODONE 10 mg 12 hr tablet  You also have another medication with the same name that you need to continue taking as instructed.      TAKE these medications        aspirin EC 81 MG tablet  Take 81 mg by mouth daily.     celecoxib 200 MG capsule  Commonly known as:  CELEBREX  Take 1 capsule (200 mg total) by mouth every 12 (twelve) hours.     enoxaparin 40 MG/0.4ML injection  Commonly known as:  LOVENOX  Inject 0.4 mLs (40 mg total) into the skin daily.     methocarbamol 500 MG tablet  Commonly known as:  ROBAXIN  Take 1 tablet (500 mg total) by mouth every 6 (six) hours as needed for muscle spasms.     ondansetron 4 MG tablet  Commonly known as:  ZOFRAN  Take 1 tablet (4 mg total) by mouth every 6 (six) hours as needed for nausea.     oxyCODONE 5 MG immediate release tablet  Commonly known as:  Oxy IR/ROXICODONE  Take 1-2 tablets (5-10 mg total) by mouth every 3 (three) hours as needed for breakthrough pain.     oxyCODONE 10 mg 12 hr tablet  Commonly known as:  OXYCONTIN  Take 1 tablet (10 mg total) by mouth every 12 (twelve) hours.        FOLLOW UP VISIT:       Follow-up Information    Follow up with Raymon Mutton, MD. Call on 03/30/2015.   Specialty:  Orthopedic Surgery   Contact information:   [address] [place] [address] [phone]       Follow up with [facility]   Why:  They will contact you to schedule home therapy visits.   Contact information:   [address] [place] [address] [phone]       DISPOSITION: HOME VS. SNF  CONDITION:  Good   Guy Sandifer 03/24/2015, 2:30 PM

## 2015-03-26 DIAGNOSIS — Z87891 Personal history of nicotine dependence: Secondary | ICD-10-CM | POA: Diagnosis not present

## 2015-03-26 DIAGNOSIS — Z4733 Aftercare following explantation of knee joint prosthesis: Secondary | ICD-10-CM | POA: Diagnosis not present

## 2015-03-26 DIAGNOSIS — Z96651 Presence of right artificial knee joint: Secondary | ICD-10-CM | POA: Diagnosis not present

## 2015-03-26 DIAGNOSIS — Z96662 Presence of left artificial ankle joint: Secondary | ICD-10-CM | POA: Diagnosis not present

## 2015-03-26 DIAGNOSIS — F419 Anxiety disorder, unspecified: Secondary | ICD-10-CM | POA: Diagnosis not present

## 2015-03-29 DIAGNOSIS — Z96662 Presence of left artificial ankle joint: Secondary | ICD-10-CM | POA: Diagnosis not present

## 2015-03-29 DIAGNOSIS — F419 Anxiety disorder, unspecified: Secondary | ICD-10-CM | POA: Diagnosis not present

## 2015-03-29 DIAGNOSIS — Z96651 Presence of right artificial knee joint: Secondary | ICD-10-CM | POA: Diagnosis not present

## 2015-03-29 DIAGNOSIS — Z87891 Personal history of nicotine dependence: Secondary | ICD-10-CM | POA: Diagnosis not present

## 2015-03-29 DIAGNOSIS — Z4733 Aftercare following explantation of knee joint prosthesis: Secondary | ICD-10-CM | POA: Diagnosis not present

## 2015-03-30 DIAGNOSIS — M25661 Stiffness of right knee, not elsewhere classified: Secondary | ICD-10-CM | POA: Diagnosis not present

## 2015-03-30 DIAGNOSIS — M25561 Pain in right knee: Secondary | ICD-10-CM | POA: Diagnosis not present

## 2015-03-30 DIAGNOSIS — Z96651 Presence of right artificial knee joint: Secondary | ICD-10-CM | POA: Diagnosis not present

## 2015-03-30 DIAGNOSIS — R262 Difficulty in walking, not elsewhere classified: Secondary | ICD-10-CM | POA: Diagnosis not present

## 2015-04-01 DIAGNOSIS — Z79891 Long term (current) use of opiate analgesic: Secondary | ICD-10-CM | POA: Diagnosis not present

## 2015-04-01 DIAGNOSIS — G894 Chronic pain syndrome: Secondary | ICD-10-CM | POA: Diagnosis not present

## 2015-04-01 DIAGNOSIS — G90522 Complex regional pain syndrome I of left lower limb: Secondary | ICD-10-CM | POA: Diagnosis not present

## 2015-04-01 DIAGNOSIS — M5136 Other intervertebral disc degeneration, lumbar region: Secondary | ICD-10-CM | POA: Diagnosis not present

## 2015-04-02 DIAGNOSIS — M25661 Stiffness of right knee, not elsewhere classified: Secondary | ICD-10-CM | POA: Diagnosis not present

## 2015-04-02 DIAGNOSIS — Z96651 Presence of right artificial knee joint: Secondary | ICD-10-CM | POA: Diagnosis not present

## 2015-04-02 DIAGNOSIS — R262 Difficulty in walking, not elsewhere classified: Secondary | ICD-10-CM | POA: Diagnosis not present

## 2015-04-06 DIAGNOSIS — M25661 Stiffness of right knee, not elsewhere classified: Secondary | ICD-10-CM | POA: Diagnosis not present

## 2015-04-06 DIAGNOSIS — R262 Difficulty in walking, not elsewhere classified: Secondary | ICD-10-CM | POA: Diagnosis not present

## 2015-04-06 DIAGNOSIS — Z96651 Presence of right artificial knee joint: Secondary | ICD-10-CM | POA: Diagnosis not present

## 2015-04-08 DIAGNOSIS — R262 Difficulty in walking, not elsewhere classified: Secondary | ICD-10-CM | POA: Diagnosis not present

## 2015-04-08 DIAGNOSIS — Z96651 Presence of right artificial knee joint: Secondary | ICD-10-CM | POA: Diagnosis not present

## 2015-04-08 DIAGNOSIS — M25661 Stiffness of right knee, not elsewhere classified: Secondary | ICD-10-CM | POA: Diagnosis not present

## 2015-04-12 DIAGNOSIS — M25661 Stiffness of right knee, not elsewhere classified: Secondary | ICD-10-CM | POA: Diagnosis not present

## 2015-04-12 DIAGNOSIS — Z96651 Presence of right artificial knee joint: Secondary | ICD-10-CM | POA: Diagnosis not present

## 2015-04-12 DIAGNOSIS — R262 Difficulty in walking, not elsewhere classified: Secondary | ICD-10-CM | POA: Diagnosis not present

## 2015-04-14 DIAGNOSIS — M25661 Stiffness of right knee, not elsewhere classified: Secondary | ICD-10-CM | POA: Diagnosis not present

## 2015-04-14 DIAGNOSIS — R262 Difficulty in walking, not elsewhere classified: Secondary | ICD-10-CM | POA: Diagnosis not present

## 2015-04-14 DIAGNOSIS — Z96651 Presence of right artificial knee joint: Secondary | ICD-10-CM | POA: Diagnosis not present

## 2015-04-16 DIAGNOSIS — M25661 Stiffness of right knee, not elsewhere classified: Secondary | ICD-10-CM | POA: Diagnosis not present

## 2015-04-16 DIAGNOSIS — R262 Difficulty in walking, not elsewhere classified: Secondary | ICD-10-CM | POA: Diagnosis not present

## 2015-04-16 DIAGNOSIS — Z96651 Presence of right artificial knee joint: Secondary | ICD-10-CM | POA: Diagnosis not present

## 2015-04-19 DIAGNOSIS — R262 Difficulty in walking, not elsewhere classified: Secondary | ICD-10-CM | POA: Diagnosis not present

## 2015-04-19 DIAGNOSIS — Z96651 Presence of right artificial knee joint: Secondary | ICD-10-CM | POA: Diagnosis not present

## 2015-04-19 DIAGNOSIS — M25661 Stiffness of right knee, not elsewhere classified: Secondary | ICD-10-CM | POA: Diagnosis not present

## 2015-04-21 DIAGNOSIS — Z96651 Presence of right artificial knee joint: Secondary | ICD-10-CM | POA: Diagnosis not present

## 2015-04-21 DIAGNOSIS — M25661 Stiffness of right knee, not elsewhere classified: Secondary | ICD-10-CM | POA: Diagnosis not present

## 2015-04-21 DIAGNOSIS — R262 Difficulty in walking, not elsewhere classified: Secondary | ICD-10-CM | POA: Diagnosis not present

## 2015-04-26 DIAGNOSIS — Z96651 Presence of right artificial knee joint: Secondary | ICD-10-CM | POA: Diagnosis not present

## 2015-04-26 DIAGNOSIS — M25661 Stiffness of right knee, not elsewhere classified: Secondary | ICD-10-CM | POA: Diagnosis not present

## 2015-04-26 DIAGNOSIS — R262 Difficulty in walking, not elsewhere classified: Secondary | ICD-10-CM | POA: Diagnosis not present

## 2015-04-28 DIAGNOSIS — M25661 Stiffness of right knee, not elsewhere classified: Secondary | ICD-10-CM | POA: Diagnosis not present

## 2015-04-28 DIAGNOSIS — Z96651 Presence of right artificial knee joint: Secondary | ICD-10-CM | POA: Diagnosis not present

## 2015-04-28 DIAGNOSIS — R262 Difficulty in walking, not elsewhere classified: Secondary | ICD-10-CM | POA: Diagnosis not present

## 2015-05-03 DIAGNOSIS — R262 Difficulty in walking, not elsewhere classified: Secondary | ICD-10-CM | POA: Diagnosis not present

## 2015-05-03 DIAGNOSIS — M25661 Stiffness of right knee, not elsewhere classified: Secondary | ICD-10-CM | POA: Diagnosis not present

## 2015-05-03 DIAGNOSIS — Z96651 Presence of right artificial knee joint: Secondary | ICD-10-CM | POA: Diagnosis not present

## 2015-07-16 DIAGNOSIS — Z1231 Encounter for screening mammogram for malignant neoplasm of breast: Secondary | ICD-10-CM | POA: Diagnosis not present

## 2015-07-22 DIAGNOSIS — Z79891 Long term (current) use of opiate analgesic: Secondary | ICD-10-CM | POA: Diagnosis not present

## 2015-07-22 DIAGNOSIS — G894 Chronic pain syndrome: Secondary | ICD-10-CM | POA: Diagnosis not present

## 2015-07-22 DIAGNOSIS — G90522 Complex regional pain syndrome I of left lower limb: Secondary | ICD-10-CM | POA: Diagnosis not present

## 2015-07-22 DIAGNOSIS — M5136 Other intervertebral disc degeneration, lumbar region: Secondary | ICD-10-CM | POA: Diagnosis not present

## 2015-08-24 DIAGNOSIS — T8484XD Pain due to internal orthopedic prosthetic devices, implants and grafts, subsequent encounter: Secondary | ICD-10-CM | POA: Diagnosis not present

## 2015-08-24 DIAGNOSIS — Z124 Encounter for screening for malignant neoplasm of cervix: Secondary | ICD-10-CM | POA: Diagnosis not present

## 2015-08-24 DIAGNOSIS — Z96659 Presence of unspecified artificial knee joint: Secondary | ICD-10-CM | POA: Diagnosis not present

## 2015-08-24 DIAGNOSIS — Z0000 Encounter for general adult medical examination without abnormal findings: Secondary | ICD-10-CM | POA: Diagnosis not present

## 2015-08-30 DIAGNOSIS — M25561 Pain in right knee: Secondary | ICD-10-CM | POA: Diagnosis not present

## 2015-11-18 DIAGNOSIS — Z79891 Long term (current) use of opiate analgesic: Secondary | ICD-10-CM | POA: Diagnosis not present

## 2015-11-18 DIAGNOSIS — G8929 Other chronic pain: Secondary | ICD-10-CM | POA: Diagnosis not present

## 2015-11-18 DIAGNOSIS — G90522 Complex regional pain syndrome I of left lower limb: Secondary | ICD-10-CM | POA: Diagnosis not present

## 2015-11-18 DIAGNOSIS — G894 Chronic pain syndrome: Secondary | ICD-10-CM | POA: Diagnosis not present

## 2015-11-22 DIAGNOSIS — Z23 Encounter for immunization: Secondary | ICD-10-CM | POA: Diagnosis not present

## 2016-01-28 DIAGNOSIS — Z471 Aftercare following joint replacement surgery: Secondary | ICD-10-CM | POA: Diagnosis not present

## 2016-01-28 DIAGNOSIS — G8929 Other chronic pain: Secondary | ICD-10-CM | POA: Diagnosis not present

## 2016-01-28 DIAGNOSIS — M25561 Pain in right knee: Secondary | ICD-10-CM | POA: Diagnosis not present

## 2016-01-28 DIAGNOSIS — Z96651 Presence of right artificial knee joint: Secondary | ICD-10-CM | POA: Diagnosis not present

## 2016-03-20 DIAGNOSIS — G8929 Other chronic pain: Secondary | ICD-10-CM | POA: Diagnosis not present

## 2016-03-20 DIAGNOSIS — Z471 Aftercare following joint replacement surgery: Secondary | ICD-10-CM | POA: Diagnosis not present

## 2016-03-20 DIAGNOSIS — Z96651 Presence of right artificial knee joint: Secondary | ICD-10-CM | POA: Diagnosis not present

## 2016-03-20 DIAGNOSIS — M25561 Pain in right knee: Secondary | ICD-10-CM | POA: Diagnosis not present

## 2016-04-04 DIAGNOSIS — H1131 Conjunctival hemorrhage, right eye: Secondary | ICD-10-CM | POA: Diagnosis not present

## 2016-07-17 DIAGNOSIS — G894 Chronic pain syndrome: Secondary | ICD-10-CM | POA: Diagnosis not present

## 2016-07-17 DIAGNOSIS — G8929 Other chronic pain: Secondary | ICD-10-CM | POA: Diagnosis not present

## 2016-07-17 DIAGNOSIS — M25561 Pain in right knee: Secondary | ICD-10-CM | POA: Diagnosis not present

## 2016-07-24 DIAGNOSIS — Z1231 Encounter for screening mammogram for malignant neoplasm of breast: Secondary | ICD-10-CM | POA: Diagnosis not present

## 2016-08-28 DIAGNOSIS — Z0000 Encounter for general adult medical examination without abnormal findings: Secondary | ICD-10-CM | POA: Diagnosis not present

## 2016-08-28 DIAGNOSIS — R21 Rash and other nonspecific skin eruption: Secondary | ICD-10-CM | POA: Diagnosis not present

## 2016-08-28 DIAGNOSIS — Z23 Encounter for immunization: Secondary | ICD-10-CM | POA: Diagnosis not present

## 2016-09-04 DIAGNOSIS — Z1212 Encounter for screening for malignant neoplasm of rectum: Secondary | ICD-10-CM | POA: Diagnosis not present

## 2016-09-04 DIAGNOSIS — Z1211 Encounter for screening for malignant neoplasm of colon: Secondary | ICD-10-CM | POA: Diagnosis not present

## 2016-11-13 DIAGNOSIS — G90522 Complex regional pain syndrome I of left lower limb: Secondary | ICD-10-CM | POA: Diagnosis not present

## 2017-03-09 ENCOUNTER — Other Ambulatory Visit (HOSPITAL_COMMUNITY): Payer: Self-pay | Admitting: Orthopedic Surgery

## 2017-03-09 DIAGNOSIS — Z96651 Presence of right artificial knee joint: Secondary | ICD-10-CM

## 2017-03-09 DIAGNOSIS — M25561 Pain in right knee: Secondary | ICD-10-CM | POA: Diagnosis not present

## 2017-03-16 DIAGNOSIS — Z79891 Long term (current) use of opiate analgesic: Secondary | ICD-10-CM | POA: Diagnosis not present

## 2017-03-16 DIAGNOSIS — G894 Chronic pain syndrome: Secondary | ICD-10-CM | POA: Diagnosis not present

## 2017-03-19 ENCOUNTER — Encounter (HOSPITAL_COMMUNITY)
Admission: RE | Admit: 2017-03-19 | Discharge: 2017-03-19 | Disposition: A | Discharge status: Home / Self Care | Payer: Medicare Other | Source: Ambulatory Visit | Attending: Orthopedic Surgery | Admitting: Orthopedic Surgery

## 2017-03-19 DIAGNOSIS — Z96651 Presence of right artificial knee joint: Secondary | ICD-10-CM | POA: Diagnosis not present

## 2017-03-19 DIAGNOSIS — M25561 Pain in right knee: Secondary | ICD-10-CM | POA: Diagnosis not present

## 2017-03-19 MED ORDER — TECHNETIUM TC 99M MEDRONATE IV KIT
20.8000 | PACK | Freq: Once | INTRAVENOUS | Status: AC | PRN
  Administered 2017-03-19: 20.8 via INTRAVENOUS

## 2017-05-25 IMAGING — CR DG CHEST 2V
2 series · 2 of 2 positions shown · non-contrast
Comparison: PA and lateral chest x-ray March 11, 2012

CLINICAL DATA: Preoperative exam prior to knee surgery; former
smoker.

EXAM:
CHEST  2 VIEW

[w chest pa]
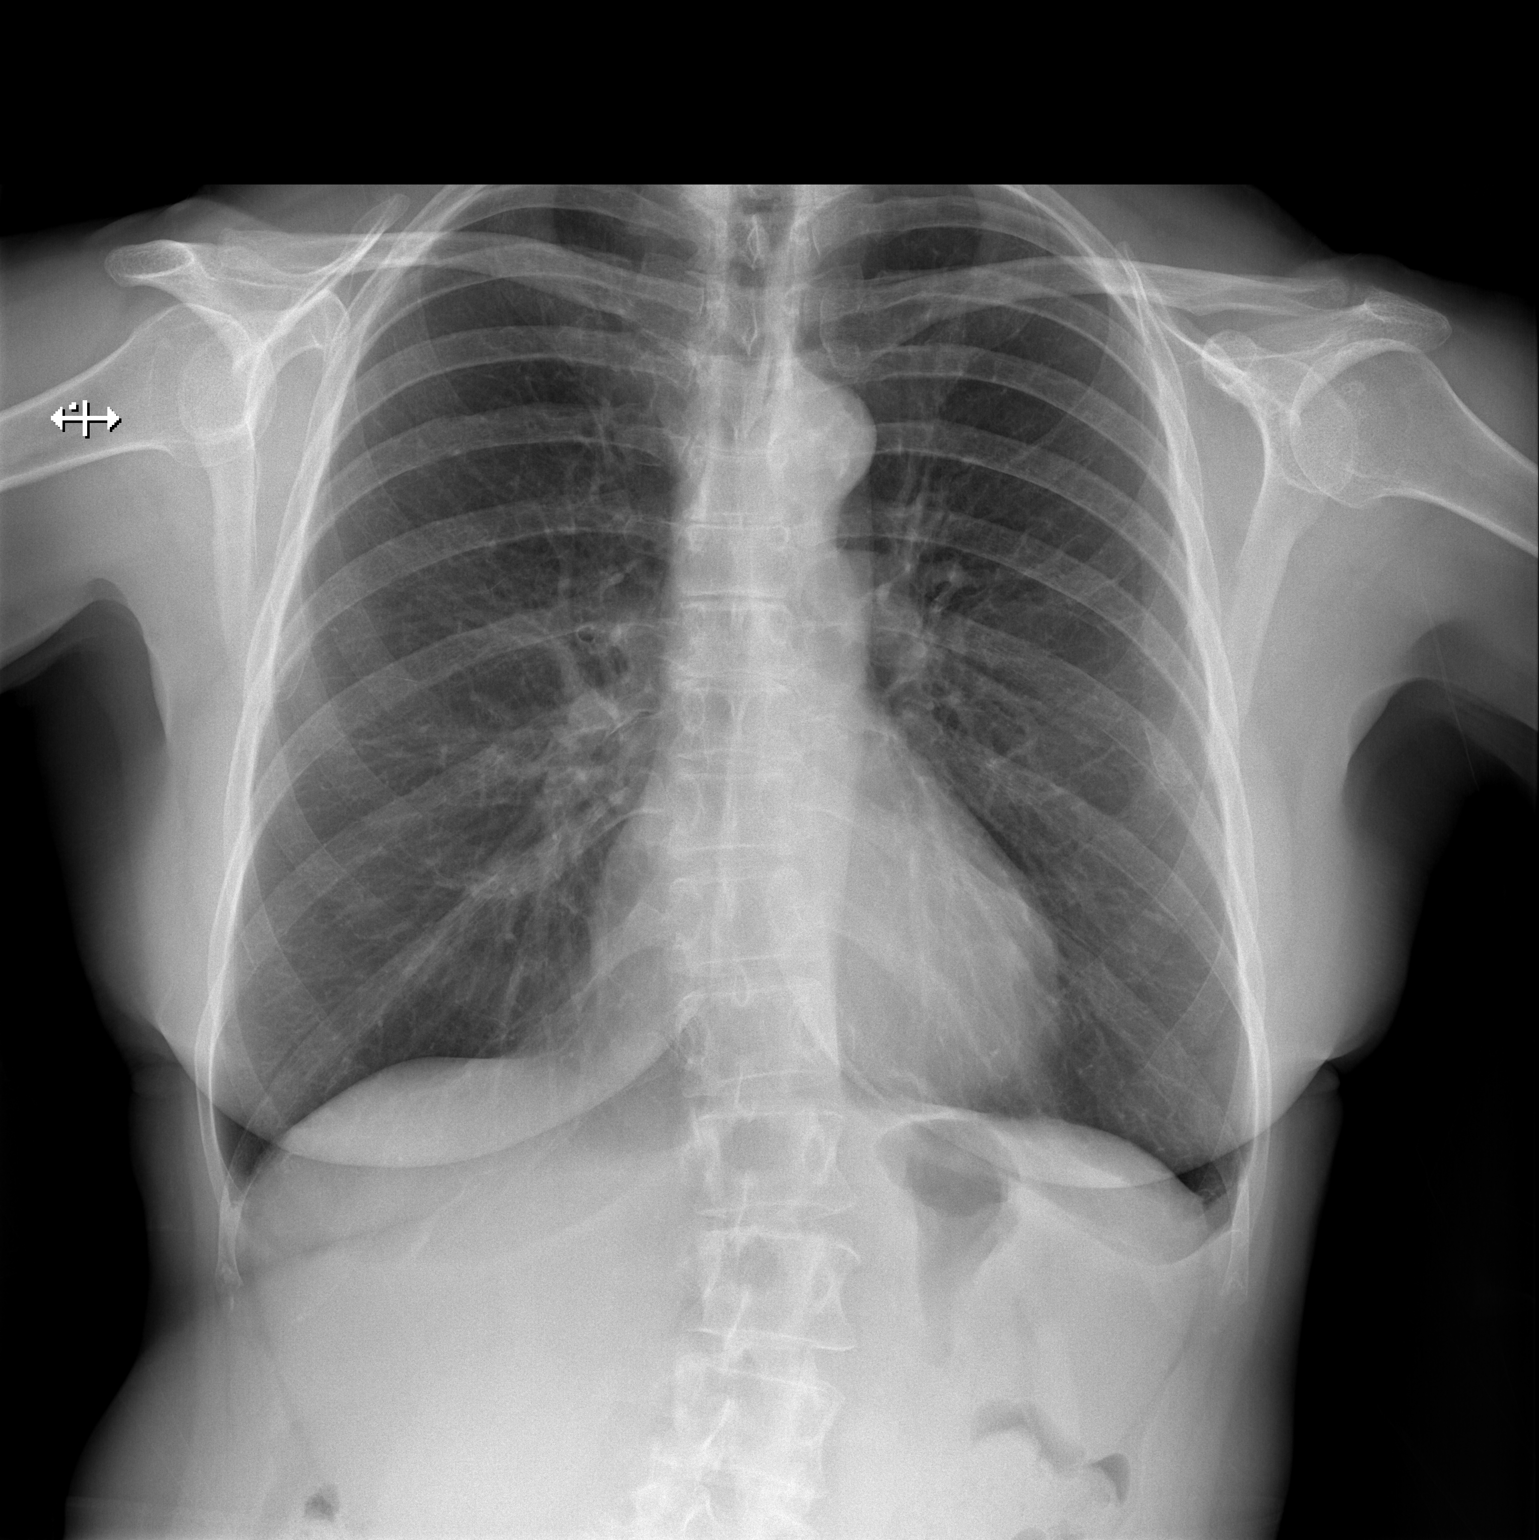

[w chest lat]
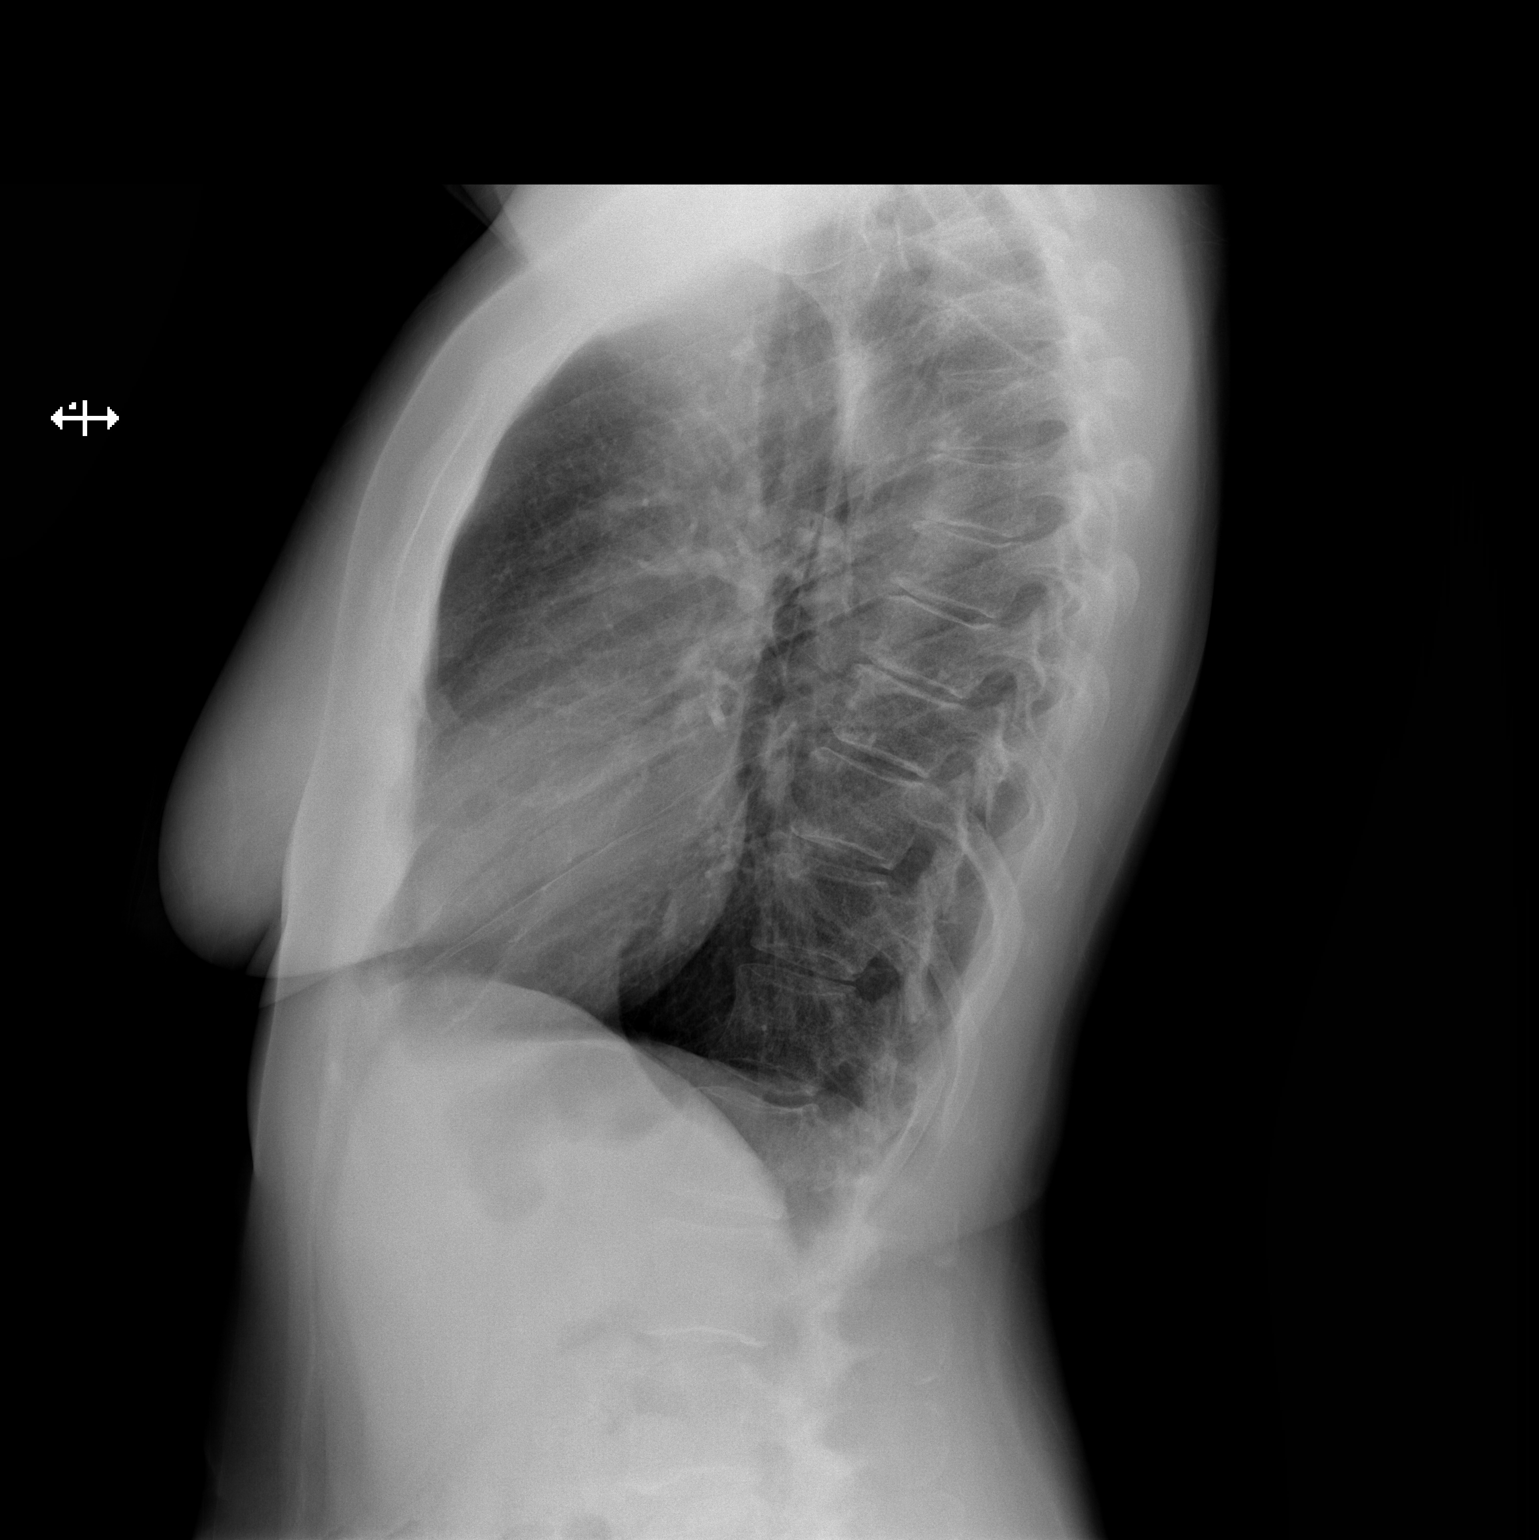

[2 of 2 positions shown; findings below may reference images not displayed]

FINDINGS: The lungs are mildly hyperinflated but clear. The heart and
pulmonary vascularity are normal. The mediastinum is normal in
width. There is no pleural effusion. There is mild multilevel
degenerative disc disease of the thoracic spine.
IMPRESSION: Mild hyperinflation which may be voluntary or could reflect
underlying COPD or reactive airway disease. There is no acute
cardiopulmonary abnormality.

## 2017-07-04 DIAGNOSIS — M25561 Pain in right knee: Secondary | ICD-10-CM | POA: Diagnosis not present

## 2017-07-04 DIAGNOSIS — Z79891 Long term (current) use of opiate analgesic: Secondary | ICD-10-CM | POA: Diagnosis not present

## 2017-07-04 DIAGNOSIS — M545 Low back pain: Secondary | ICD-10-CM | POA: Diagnosis not present

## 2017-08-09 DIAGNOSIS — M720 Palmar fascial fibromatosis [Dupuytren]: Secondary | ICD-10-CM | POA: Diagnosis not present

## 2017-08-09 DIAGNOSIS — M79642 Pain in left hand: Secondary | ICD-10-CM | POA: Diagnosis not present

## 2017-08-28 DIAGNOSIS — Z1231 Encounter for screening mammogram for malignant neoplasm of breast: Secondary | ICD-10-CM | POA: Diagnosis not present

## 2017-09-19 DIAGNOSIS — R21 Rash and other nonspecific skin eruption: Secondary | ICD-10-CM | POA: Diagnosis not present

## 2017-09-19 DIAGNOSIS — G8929 Other chronic pain: Secondary | ICD-10-CM | POA: Diagnosis not present

## 2017-09-19 DIAGNOSIS — Z13220 Encounter for screening for lipoid disorders: Secondary | ICD-10-CM | POA: Diagnosis not present

## 2017-09-19 DIAGNOSIS — Z0000 Encounter for general adult medical examination without abnormal findings: Secondary | ICD-10-CM | POA: Diagnosis not present

## 2017-09-19 DIAGNOSIS — Z1159 Encounter for screening for other viral diseases: Secondary | ICD-10-CM | POA: Diagnosis not present

## 2017-09-28 DIAGNOSIS — Z13220 Encounter for screening for lipoid disorders: Secondary | ICD-10-CM | POA: Diagnosis not present

## 2017-09-28 DIAGNOSIS — G8929 Other chronic pain: Secondary | ICD-10-CM | POA: Diagnosis not present

## 2017-09-28 DIAGNOSIS — Z1159 Encounter for screening for other viral diseases: Secondary | ICD-10-CM | POA: Diagnosis not present

## 2017-10-31 DIAGNOSIS — M545 Low back pain: Secondary | ICD-10-CM | POA: Diagnosis not present

## 2017-10-31 DIAGNOSIS — Z79891 Long term (current) use of opiate analgesic: Secondary | ICD-10-CM | POA: Diagnosis not present

## 2017-10-31 DIAGNOSIS — G894 Chronic pain syndrome: Secondary | ICD-10-CM | POA: Diagnosis not present

## 2017-11-26 DIAGNOSIS — L281 Prurigo nodularis: Secondary | ICD-10-CM | POA: Diagnosis not present

## 2017-11-26 DIAGNOSIS — L708 Other acne: Secondary | ICD-10-CM | POA: Diagnosis not present

## 2017-11-26 DIAGNOSIS — Z23 Encounter for immunization: Secondary | ICD-10-CM | POA: Diagnosis not present

## 2017-11-26 DIAGNOSIS — L821 Other seborrheic keratosis: Secondary | ICD-10-CM | POA: Diagnosis not present

## 2019-06-10 IMAGING — NM NM BONE 3 PHASE
10 series · 20 of 20 positions shown · non-contrast
Comparison: 02/20/2012

Radiographic correlation: None

CLINICAL DATA: RIGHT knee in 2532 revised in 4570 and 5109,
continued RIGHT knee pain

EXAM:
NUCLEAR MEDICINE 3-PHASE BONE SCAN
TECHNIQUE: Radionuclide angiographic images, immediate static blood pool
images, and 3-hour delayed static images were obtained of the knees
after intravenous injection of radiopharmaceutical.
RADIOPHARMACEUTICALS:  20.8 mCi Zc-44m MDP IV

[Series 1: flow · 2.07mm/px · 6 of 48 frames shown (1 of 2)]
[frame 5/48]
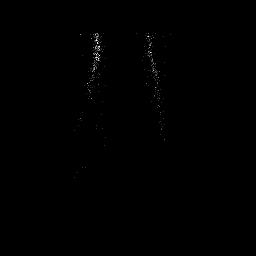
[frame 13/48  full-range]
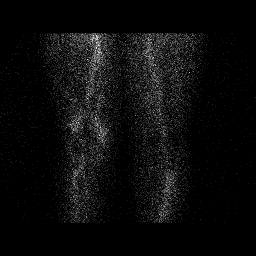
[frame 21/48  full-range]
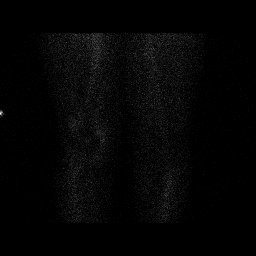
[frame 29/48  full-range]
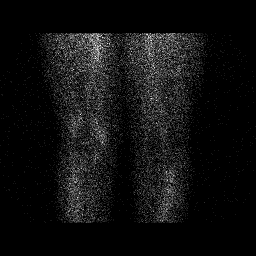
[frame 37/48  full-range]
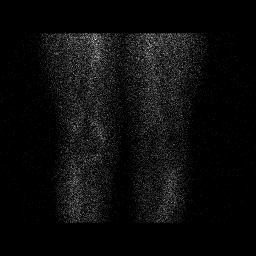
[frame 45/48  full-range]
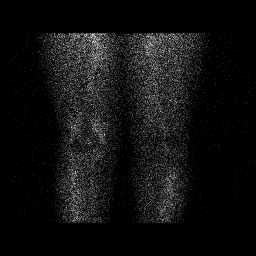

[Series 1: flow · 2.07mm/px · 6 of 48 frames shown (2 of 2)]
[frame 5/48]
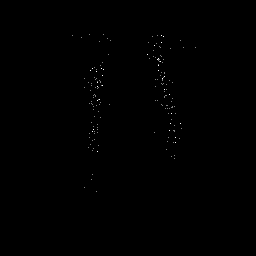
[frame 13/48  full-range]
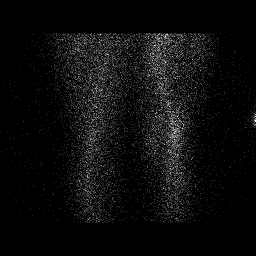
[frame 21/48  full-range]
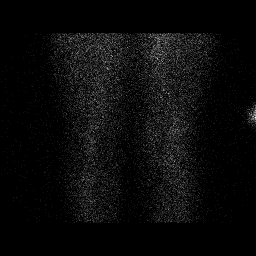
[frame 29/48  full-range]
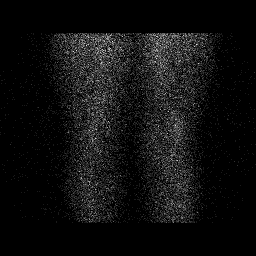
[frame 37/48  full-range]
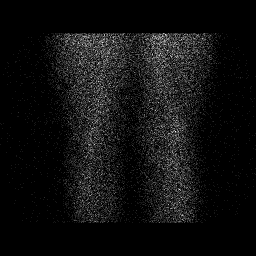
[frame 45/48  full-range]
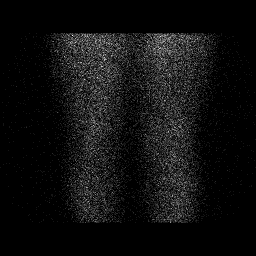

[Series 1: lat bp · 2.07mm/px · 1 of 1 slices shown (1 of 2)]
[im 1/1]
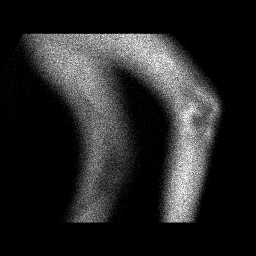

[Series 1: lat bp · 2.07mm/px · 1 of 1 slices shown (2 of 2)]
[im 1/1]
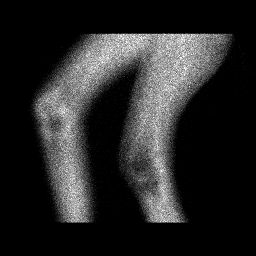

[Series 2: blood pool · 2.07mm/px · 1 of 1 slices shown (1 of 6)]
[im 1/1  full-range]
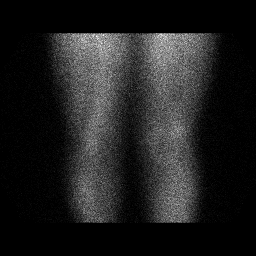

[Series 2: blood pool · 2.07mm/px · 1 of 1 slices shown (2 of 6)]
[im 1/1]
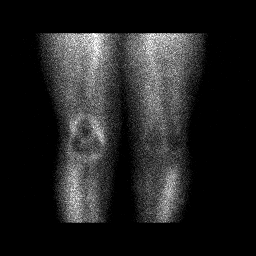

[Series 3: blood pool · 2.07mm/px · 1 of 1 slices shown (3 of 6)]
[im 1/1]
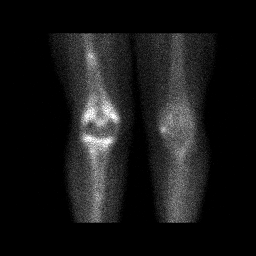

[Series 3: blood pool · 2.07mm/px · 1 of 1 slices shown (4 of 6)]
[im 1/1]
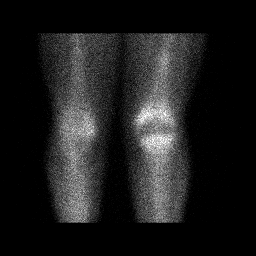

[Series 4: blood pool · 2.07mm/px · 1 of 1 slices shown (5 of 6)]
[im 1/1]
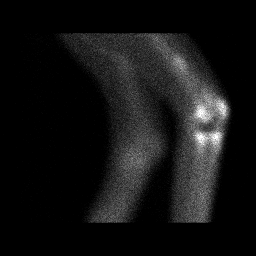

[Series 4: blood pool · 2.07mm/px · 1 of 1 slices shown (6 of 6)]
[im 1/1]
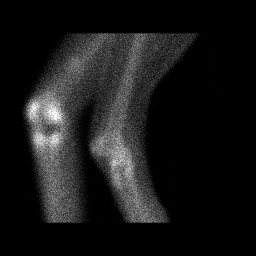

[20 of 20 positions shown; findings below may reference images not displayed]

FINDINGS: Vascular phase: Mildly increased blood flow to the RIGHT knee.
Normal blood flow to LEFT knee.

Blood pool phase: Increased blood pool surrounding RIGHT knee.
Normal blood pool LEFT knee.

Delayed phase: Minimal delayed uptake of tracer at the medial
compartment LEFT knee consistent with degenerative changes. Mild
uptake diffusely in the RIGHT knee around the tibial and femoral
components of the RIGHT knee prosthesis similar to previous exam,
favor postsurgical. A new focus of delayed increased tracer
localization is identified at the distal RIGHT femoral diaphysis,
nonspecific in the absence of radiographic correlation; this could
represent the tip of a long stem of a femoral component of a knee
prosthesis either her reflecting surgery or loosening, site of
abnormal bone turnover such as from stress fracture/trauma, or other
etiologies including tumor. Recommend radiographic correlation. No
other abnormal tracer localization is visualized.
IMPRESSION: Increased blood flow and blood pool surrounding the RIGHT knee
compatible with synovitis/inflammatory process.

No focal areas of abnormal tracer localization are seen adjacent to
the tibial or femoral components of the RIGHT knee prosthesis to
suggest loosening or infection, though a small focus of nonspecific
tracer localization is identified at the distal femoral diaphysis
which could potentially be related to a long femoral stem of a
prosthesis, which could indicate loosening or bone reaction related
to surgery. Since other etiologies including trauma/fracture and
tumor are not excluded, radiographic correlation is recommended.

## 2020-10-01 ENCOUNTER — Other Ambulatory Visit: Payer: Self-pay

## 2020-10-01 ENCOUNTER — Encounter (HOSPITAL_BASED_OUTPATIENT_CLINIC_OR_DEPARTMENT_OTHER): Payer: Self-pay | Admitting: Cardiovascular Disease

## 2020-10-01 ENCOUNTER — Ambulatory Visit (INDEPENDENT_AMBULATORY_CARE_PROVIDER_SITE_OTHER): Payer: Medicare Other | Admitting: Cardiovascular Disease

## 2020-10-01 VITALS — BP 122/78 | HR 79 | Ht 63.0 in | Wt 129.8 lb

## 2020-10-01 DIAGNOSIS — R55 Syncope and collapse: Secondary | ICD-10-CM | POA: Diagnosis not present

## 2020-10-01 DIAGNOSIS — R0789 Other chest pain: Secondary | ICD-10-CM

## 2020-10-01 DIAGNOSIS — R072 Precordial pain: Secondary | ICD-10-CM

## 2020-10-01 DIAGNOSIS — R030 Elevated blood-pressure reading, without diagnosis of hypertension: Secondary | ICD-10-CM | POA: Insufficient documentation

## 2020-10-01 HISTORY — DX: Other chest pain: R07.89

## 2020-10-01 HISTORY — DX: Elevated blood-pressure reading, without diagnosis of hypertension: R03.0

## 2020-10-01 HISTORY — DX: Syncope and collapse: R55

## 2020-10-01 MED ORDER — METOPROLOL TARTRATE 100 MG PO TABS: ORAL_TABLET | ORAL | 0 refills | Status: AC

## 2020-10-01 NOTE — Patient Instructions (Signed)
Medication Instructions:  TAKE METOPROLOL 100 MG 2 HOURS PRIOR TO CT   *If you need a refill on your cardiac medications before your next appointment, please call your pharmacy*  Lab Work: BMET 1 WEEK PRIOR TO CARDIAC CT  If you have labs (blood work) drawn today and your tests are completely normal, you will receive your results only by: MyChart Message (if you have MyChart) OR A paper copy in the mail If you have any lab test that is abnormal or we need to change your treatment, we will call you to review the results.  Testing/Procedures: Your physician has requested that you have cardiac CT. Cardiac computed tomography (CT) is a painless test that uses an x-ray machine to take clear, detailed pictures of your heart. For further information please visit [email or web address] Please follow instruction sheet as given.  Follow-Up: AS NEEDED   Other Instructions    Your cardiac CT will be scheduled at one of the below locations:   [facility] [address] [place], [place] [address] [phone]  OR  [facility] [address] [place], [place] [address] [phone]  If scheduled at [facility], please arrive at the [facility] main entrance (entrance A) of [facility] 30 minutes prior to test start time. Proceed to the [facility] Radiology Department (first floor) to check-in and test prep.  If scheduled at [facility], please arrive 15 mins early for check-in and test prep.  Please follow these instructions carefully (unless otherwise directed):  Hold all erectile dysfunction medications at least 3 days (72 hrs) prior to test.  On the Night Before the Test: Be sure to Drink plenty of water. Do not consume any caffeinated/decaffeinated beverages or chocolate 12 hours prior to your test. Do not take any antihistamines 12 hours prior to your test. If the patient has  contrast allergy: Patient will need a prescription for Prednisone and very clear instructions (as follows): Prednisone 50 mg - take 13 hours prior to test Take another Prednisone 50 mg 7 hours prior to test Take another Prednisone 50 mg 1 hour prior to test Take Benadryl 50 mg 1 hour prior to test Patient must complete all four doses of above prophylactic medications. Patient will need a ride after test due to Benadryl.  On the Day of the Test: Drink plenty of water until 1 hour prior to the test. Do not eat any food 4 hours prior to the test. You may take your regular medications prior to the test.  Take metoprolol (Lopressor) two hours prior to test. HOLD Furosemide/Hydrochlorothiazide morning of the test. FEMALES- please wear underwire-free bra if available, avoid dresses & tight clothing  After the Test: Drink plenty of water. After receiving IV contrast, you may experience a mild flushed feeling. This is normal. On occasion, you may experience a mild rash up to 24 hours after the test. This is not dangerous. If this occurs, you can take Benadryl 25 mg and increase your fluid intake. If you experience trouble breathing, this can be serious. If it is severe call 911 IMMEDIATELY. If it is mild, please call our office. If you take any of these medications: Glipizide/Metformin, Avandament, Glucavance, please do not take 48 hours after completing test unless otherwise instructed.  Please allow 2-4 weeks for scheduling of routine cardiac CTs. Some insurance companies require a pre-authorization which may delay scheduling of this test.   For non-scheduling related questions, please contact  the cardiac imaging nurse navigator should you have any questions/concerns: Angela Burnett, Cardiac Imaging Nurse Navigator Angela Burnett, Cardiac Imaging Nurse Navigator [facility] Direct Office Dial: [phone]   For scheduling needs, including cancellations and  rescheduling, please call Angela Burnett, [phone].  Cardiac CT Angiogram A cardiac CT angiogram is a procedure to look at the heart and the area around the heart. It may be done to help find the cause of chest pains or other symptoms of heart disease. During this procedure, a substance called contrast dye is injected into the blood vessels in the area to be checked. A large X-ray machine, called a CT scanner, then takes detailed pictures of the heart and the surrounding area. The procedure is also sometimes called a coronary CT angiogram, coronary artery scanning, or CTA. A cardiac CT angiogram allows the health care provider to see how well blood is flowing to and from the heart. The health care provider will be able to see if there are any problems, such as: Blockage or narrowing of the coronary arteries in the heart. Fluid around the heart. Signs of weakness or disease in the muscles, valves, and tissues of the heart. Tell a health care provider about: Any allergies you have. This is especially important if you have had a previous allergic reaction to contrast dye. All medicines you are taking, including vitamins, herbs, eye drops, creams, and over-the-counter medicines. Any blood disorders you have. Any surgeries you have had. Any medical conditions you have. Whether you are pregnant or may be pregnant. Any anxiety disorders, chronic pain, or other conditions you have that may increase your stress or prevent you from lying still. What are the risks? Generally, this is a safe procedure. However, problems may occur, including: Bleeding. Infection. Allergic reactions to medicines or dyes. Damage to other structures or organs. Kidney damage from the contrast dye that is used. Increased risk of cancer from radiation exposure. This risk is low. Talk with your health care provider about: The risks and benefits of testing. How you can receive the lowest dose of radiation. What happens before the  procedure? Wear comfortable clothing and remove any jewelry, glasses, dentures, and hearing aids. Follow instructions from your health care provider about eating and drinking. This may include: For 12 hours before the procedure -- avoid caffeine. This includes tea, coffee, soda, energy drinks, and diet pills. Drink plenty of water or other fluids that do not have caffeine in them. Being well hydrated can prevent complications. For 4-6 hours before the procedure -- stop eating and drinking. The contrast dye can cause nausea, but this is less likely if your stomach is empty. Ask your health care provider about changing or stopping your regular medicines. This is especially important if you are taking diabetes medicines, blood thinners, or medicines to treat problems with erections (erectile dysfunction). What happens during the procedure?  Hair on your chest may need to be removed so that small sticky patches called electrodes can be placed on your chest. These will transmit information that helps to monitor your heart during the procedure. An IV will be inserted into one of your veins. You might be given a medicine to control your heart rate during the procedure. This will help to ensure that good images are obtained. You will be asked to lie on an exam table. This table will slide in and out of the CT machine during the procedure. Contrast dye will be injected into the IV. You might feel  warm, or you may get a metallic taste in your mouth. You will be given a medicine called nitroglycerin. This will relax or dilate the arteries in your heart. The table that you are lying on will move into the CT machine tunnel for the scan. The person running the machine will give you instructions while the scans are being done. You may be asked to: Keep your arms above your head. Hold your breath. Stay very still, even if the table is moving. When the scanning is complete, you will be moved out of the  machine. The IV will be removed. The procedure may vary among health care providers and hospitals. What can I expect after the procedure? After your procedure, it is common to have: A metallic taste in your mouth from the contrast dye. A feeling of warmth. A headache from the nitroglycerin. Follow these instructions at home: Take over-the-counter and prescription medicines only as told by your health care provider. If you are told, drink enough fluid to keep your urine pale yellow. This will help to flush the contrast dye out of your body. Most people can return to their normal activities right after the procedure. Ask your health care provider what activities are safe for you. It is up to you to get the results of your procedure. Ask your health care provider, or the department that is doing the procedure, when your results will be ready. Keep all follow-up visits as told by your health care provider. This is important. Contact a health care provider if: You have any symptoms of allergy to the contrast dye. These include: Shortness of breath. Rash or hives. A racing heartbeat. Summary A cardiac CT angiogram is a procedure to look at the heart and the area around the heart. It may be done to help find the cause of chest pains or other symptoms of heart disease. During this procedure, a large X-ray machine, called a CT scanner, takes detailed pictures of the heart and the surrounding area after a contrast dye has been injected into blood vessels in the area. Ask your health care provider about changing or stopping your regular medicines before the procedure. This is especially important if you are taking diabetes medicines, blood thinners, or medicines to treat erectile dysfunction. If you are told, drink enough fluid to keep your urine pale yellow. This will help to flush the contrast dye out of your body. This information is not intended to replace advice given to you by your health care  provider. Make sure you discuss any questions you have with your health care provider. Document Revised: 09/04/2018 Document Reviewed: 09/04/2018 Elsevier Patient Education  2022 [employer].

## 2020-10-01 NOTE — Assessment & Plan Note (Signed)
Symptoms are very atypical and not with exertion.  She has struggled with many deaths and has been very stressed.  She had 1 episode of chest discomfort when laying on the bed.  She does have family history of CAD.  Lipids and blood pressure are borderline.  We will get a coronary CTA to evaluate for obstructive coronary disease.

## 2020-10-01 NOTE — Assessment & Plan Note (Signed)
She had a single episode of near syncope when getting up from bed.  We will repeat orthostatics.  Recommend increasing fluid intake.

## 2020-10-01 NOTE — Assessment & Plan Note (Signed)
Blood pressure was initially elevated but better on repeat.  She notes that it has been borderline at home.  We discussed limiting sodium intake as she gets most of her meals at restaurants.  We also discussed increasing her exercise.  Her dog walks slow so she may need to do some exercise outside of her dog walking.  Would not start any medications for now.

## 2020-10-01 NOTE — Progress Notes (Signed)
Cardiology Office Note:    Date:  10/01/2020   ID:  Angela Burnett, DOB 05-31-1950, MRN [id number]  PCP:  [facility]   [facility] Providers Cardiologist:  Chilton Si, MD     Referring MD: Richmond Campbell., PA-C   No chief complaint on file. CC: Chest pain  History of Present Illness:    Angela Burnett is a 70 y.o. female with no significant history, here for evaluation of chest pain. She saw Mady Gemma PA-C, 09/28/20 and reported episodes of chest pain. EKG at that time was sinus rhythm, referred to cardiology for further evaluation.  Today, patient reports she feels okay.  She has been experiencing chest pains that feels like nausea to her. She reported she have been experiencing chest pains for years but recently worsened in the last couple months. She feels her episodes are due to deaths in the family and has attended 3 funerals since July. She talks to her husband and sister for comfort. She sleeps on her left side and stated it "feels like a brick" underneath her armpit and when siting up she experienced dizziness and lightheadedness the other day. She also stated to having occasional LE edema but due to surgeries. She denies shortness of breath. She walks her dog and go up the stairs with no complications but does not exercise. She drinks 1-1.5 cups of coffee and alcohol occasionally and is a "junk food junkie."  Recently, at home blood pressure has ranged systolic in the 130s and it bothers her because she is use to lower numbers. Her mother died at age 24 with history of heart issues, diabetes and hypertension and father died at 53 with history of diabetes. Her son has heart issues. Older brother has prostate cancer, older sister has colon cancer, and younger sister has colon cancer.    Past Medical History:  Diagnosis Date   Anxiety    Atypical chest pain 10/01/2020   Elevated blood pressure reading 10/01/2020   Headache(784.0)     Near syncope 10/01/2020   Shortness of breath     Past Surgical History:  Procedure Laterality Date   ANKLE RECONSTRUCTION     CARDIAC CATHETERIZATION      clean 2012   dr berry   EXCISIONAL TOTAL KNEE ARTHROPLASTY Right 03/18/2012   Procedure: EXCISIONAL TOTAL KNEE ARTHROPLASTY;  Surgeon: Dannielle Huh, MD;  Location: [facility] OR;  Service: Orthopedics;  Laterality: Right;  right total knee polyexchange    JOINT REPLACEMENT     x5 lt ankle   KNEE ARTHROPLASTY     TOTAL KNEE REVISION Right 03/15/2015   Procedure: RIGHT TOTAL KNEE REVISION;  Surgeon: Dannielle Huh, MD;  Location: [facility] OR;  Service: Orthopedics;  Laterality: Right;    Current Medications: Current Meds  Medication Sig   acetaminophen (TYLENOL) 500 MG tablet Take 1,000 mg by mouth as needed.   aspirin EC 81 MG tablet Take 81 mg by mouth daily.   methocarbamol (ROBAXIN) 500 MG tablet Take 1 tablet (500 mg total) by mouth every 6 (six) hours as needed for muscle spasms.   metoprolol tartrate (LOPRESSOR) 100 MG tablet Take 1 tablet 2 hours prior to CT   morphine (MS CONTIN) 60 MG 12 hr tablet Take 60 mg by mouth daily.   oxyCODONE (ROXICODONE) 15 MG immediate release tablet oxycodone 15 mg tablet  TAKE 1 TABLET 3 TIMES A DAY BY ORAL ROUTE AS NEEDED.   pantoprazole (PROTONIX) 40 MG tablet Take  40 mg by mouth daily.     Allergies:   Patient has no known allergies.   Social History   Socioeconomic History   Marital status: Married    Spouse name: Not on file   Number of children: Not on file   Years of education: Not on file   Highest education level: Not on file  Occupational History   Not on file  Tobacco Use   Smoking status: Former    Types: Cigarettes    Quit date: 03/11/2009    Years since quitting: 11.5   Smokeless tobacco: Not on file  Substance and Sexual Activity   Alcohol use: Yes    Comment: occ   Drug use: No   Sexual activity: Not on file  Other Topics Concern   Not on file  Social History Narrative    Not on file   Social Determinants of Health   Financial Resource Strain: Low Risk    Difficulty of Paying Living Expenses: Not hard at all  Food Insecurity: No Food Insecurity   Worried About Programme researcher, broadcasting/film/video in the Last Year: Never true   Ran Out of Food in the Last Year: Never true  Transportation Needs: No Transportation Needs   Lack of Transportation (Medical): No   Lack of Transportation (Non-Medical): No  Physical Activity: Inactive   Days of Exercise per Week: 0 days   Minutes of Exercise per Session: 0 min  Stress: Not on file  Social Connections: Not on file     Family History: The patient's family history includes Arrhythmia in her brother; Colon cancer in her sister; Dementia in her sister; Diabetes in her maternal grandmother and mother; Heart attack (age of onset: 28) in her father; Heart disease in her maternal grandmother and mother; Hypertension in her mother; Kidney disease in her sister; Prostate cancer in her brother.  ROS:   Please see the history of present illness.    (+) Nausea (+) LE edema  (+) lightheadedness (+) dizziness  All other systems reviewed and are negative.  EKGs/Labs/Other Studies Reviewed:    The following studies were reviewed today: No studies available at this time.   EKG:  9/22: sinus rhythm. 79 bpm.   Recent Labs: No results found for requested labs within last 8760 hours.  Recent Lipid Panel No results found for: CHOL, TRIG, HDL, CHOLHDL, VLDL, LDLCALC, LDLDIRECT       Physical Exam:    VS:  BP 122/78 (BP Location: Left Arm, Patient Position: Sitting)   Pulse 79   Ht 5\' 3"  (1.6 m)   Wt 129 lb 12.8 oz (58.9 kg)   BMI 22.99 kg/m  , BMI Body mass index is 22.99 kg/m. GENERAL:  Well appearing HEENT: Pupils equal round and reactive, fundi not visualized, oral mucosa unremarkable NECK:  No jugular venous distention, waveform within normal limits, carotid upstroke brisk and symmetric, no bruits LUNGS:  Clear to  auscultation bilaterally HEART:  RRR.  PMI not displaced or sustained,S1 and S2 within normal limits, no S3, no S4, no clicks, no rubs, no murmurs ABD:  Flat, positive bowel sounds normal in frequency in pitch, no bruits, no rebound, no guarding, no midline pulsatile mass, no hepatomegaly, no splenomegaly EXT:  2 plus pulses throughout, no edema, no cyanosis no clubbing SKIN:  No rashes no nodules NEURO:  Cranial nerves II through XII grossly intact, motor grossly intact throughout PSYCH:  Cognitively intact, oriented to person place and time   ASSESSMENT:  1. Precordial chest pain   2. Atypical chest pain   3. Near syncope   4. Elevated blood pressure reading    PLAN:    Atypical chest pain Symptoms are very atypical and not with exertion.  She has struggled with many deaths and has been very stressed.  She had 1 episode of chest discomfort when laying on the bed.  She does have family history of CAD.  Lipids and blood pressure are borderline.  We will get a coronary CTA to evaluate for obstructive coronary disease.  Near syncope She had a single episode of near syncope when getting up from bed.  We will repeat orthostatics.  Recommend increasing fluid intake.  Elevated blood pressure reading Blood pressure was initially elevated but better on repeat.  She notes that it has been borderline at home.  We discussed limiting sodium intake as she gets most of her meals at restaurants.  We also discussed increasing her exercise.  Her dog walks slow so she may need to do some exercise outside of her dog walking.  Would not start any medications for now.         Medication Adjustments/Labs and Tests Ordered: Current medicines are reviewed at length with the patient today.  Concerns regarding medicines are outlined above.  Orders Placed This Encounter  Procedures   CT CORONARY MORPH W/CTA COR W/SCORE W/CA W/CM &/OR WO/CM   Basic metabolic panel   EKG 12-Lead   Meds ordered this  encounter  Medications   metoprolol tartrate (LOPRESSOR) 100 MG tablet    Sig: Take 1 tablet 2 hours prior to CT    Dispense:  1 tablet    Refill:  0    Patient Instructions  Medication Instructions:  TAKE METOPROLOL 100 MG 2 HOURS PRIOR TO CT   *If you need a refill on your cardiac medications before your next appointment, please call your pharmacy*  Lab Work: BMET 1 WEEK PRIOR TO CARDIAC CT  If you have labs (blood work) drawn today and your tests are completely normal, you will receive your results only by: MyChart Message (if you have MyChart) OR A paper copy in the mail If you have any lab test that is abnormal or we need to change your treatment, we will call you to review the results.  Testing/Procedures: Your physician has requested that you have cardiac CT. Cardiac computed tomography (CT) is a painless test that uses an x-ray machine to take clear, detailed pictures of your heart. For further information please visit [email or web address] Please follow instruction sheet as given.  Follow-Up: AS NEEDED   Other Instructions    Your cardiac CT will be scheduled at one of the below locations:   [facility] [address] [place], [place] [address] [phone]  OR  [facility] [address] [place], [place] [address] [phone]  If scheduled at [facility], please arrive at the [facility] main entrance (entrance A) of [facility] 30 minutes prior to test start time. Proceed to the [facility] Radiology Department (first floor) to check-in and test prep.  If scheduled at [facility], please arrive 15 mins early for check-in and test prep.  Please follow these instructions carefully (unless otherwise directed):  Hold all erectile dysfunction medications at least 3 days (72 hrs) prior to test.  On the Night Before the Test: Be sure to Drink plenty of  water. Do not consume any caffeinated/decaffeinated  beverages or chocolate 12 hours prior to your test. Do not take any antihistamines 12 hours prior to your test. If the patient has contrast allergy: Patient will need a prescription for Prednisone and very clear instructions (as follows): Prednisone 50 mg - take 13 hours prior to test Take another Prednisone 50 mg 7 hours prior to test Take another Prednisone 50 mg 1 hour prior to test Take Benadryl 50 mg 1 hour prior to test Patient must complete all four doses of above prophylactic medications. Patient will need a ride after test due to Benadryl.  On the Day of the Test: Drink plenty of water until 1 hour prior to the test. Do not eat any food 4 hours prior to the test. You may take your regular medications prior to the test.  Take metoprolol (Lopressor) two hours prior to test. HOLD Furosemide/Hydrochlorothiazide morning of the test. FEMALES- please wear underwire-free bra if available, avoid dresses & tight clothing  After the Test: Drink plenty of water. After receiving IV contrast, you may experience a mild flushed feeling. This is normal. On occasion, you may experience a mild rash up to 24 hours after the test. This is not dangerous. If this occurs, you can take Benadryl 25 mg and increase your fluid intake. If you experience trouble breathing, this can be serious. If it is severe call 911 IMMEDIATELY. If it is mild, please call our office. If you take any of these medications: Glipizide/Metformin, Avandament, Glucavance, please do not take 48 hours after completing test unless otherwise instructed.  Please allow 2-4 weeks for scheduling of routine cardiac CTs. Some insurance companies require a pre-authorization which may delay scheduling of this test.   For non-scheduling related questions, please contact the cardiac imaging nurse navigator should you have any questions/concerns: Rockwell Alexandria, Cardiac Imaging Nurse  Navigator Larey Brick, Cardiac Imaging Nurse Navigator [facility] Direct Office Dial: [phone]   For scheduling needs, including cancellations and rescheduling, please call [place], [phone].  Cardiac CT Angiogram A cardiac CT angiogram is a procedure to look at the heart and the area around the heart. It may be done to help find the cause of chest pains or other symptoms of heart disease. During this procedure, a substance called contrast dye is injected into the blood vessels in the area to be checked. A large X-ray machine, called a CT scanner, then takes detailed pictures of the heart and the surrounding area. The procedure is also sometimes called a coronary CT angiogram, coronary artery scanning, or CTA. A cardiac CT angiogram allows the health care provider to see how well blood is flowing to and from the heart. The health care provider will be able to see if there are any problems, such as: Blockage or narrowing of the coronary arteries in the heart. Fluid around the heart. Signs of weakness or disease in the muscles, valves, and tissues of the heart. Tell a health care provider about: Any allergies you have. This is especially important if you have had a previous allergic reaction to contrast dye. All medicines you are taking, including vitamins, herbs, eye drops, creams, and over-the-counter medicines. Any blood disorders you have. Any surgeries you have had. Any medical conditions you have. Whether you are pregnant or may be pregnant. Any anxiety disorders, chronic pain, or other conditions you have that may increase your stress or prevent you from lying still. What are the risks? Generally, this is a safe procedure. However, problems may occur, including:  Bleeding. Infection. Allergic reactions to medicines or dyes. Damage to other structures or organs. Kidney damage from the contrast dye that is used. Increased risk of cancer from  radiation exposure. This risk is low. Talk with your health care provider about: The risks and benefits of testing. How you can receive the lowest dose of radiation. What happens before the procedure? Wear comfortable clothing and remove any jewelry, glasses, dentures, and hearing aids. Follow instructions from your health care provider about eating and drinking. This may include: For 12 hours before the procedure -- avoid caffeine. This includes tea, coffee, soda, energy drinks, and diet pills. Drink plenty of water or other fluids that do not have caffeine in them. Being well hydrated can prevent complications. For 4-6 hours before the procedure -- stop eating and drinking. The contrast dye can cause nausea, but this is less likely if your stomach is empty. Ask your health care provider about changing or stopping your regular medicines. This is especially important if you are taking diabetes medicines, blood thinners, or medicines to treat problems with erections (erectile dysfunction). What happens during the procedure?  Hair on your chest may need to be removed so that small sticky patches called electrodes can be placed on your chest. These will transmit information that helps to monitor your heart during the procedure. An IV will be inserted into one of your veins. You might be given a medicine to control your heart rate during the procedure. This will help to ensure that good images are obtained. You will be asked to lie on an exam table. This table will slide in and out of the CT machine during the procedure. Contrast dye will be injected into the IV. You might feel warm, or you may get a metallic taste in your mouth. You will be given a medicine called nitroglycerin. This will relax or dilate the arteries in your heart. The table that you are lying on will move into the CT machine tunnel for the scan. The person running the machine will give you instructions while the scans are being done.  You may be asked to: Keep your arms above your head. Hold your breath. Stay very still, even if the table is moving. When the scanning is complete, you will be moved out of the machine. The IV will be removed. The procedure may vary among health care providers and hospitals. What can I expect after the procedure? After your procedure, it is common to have: A metallic taste in your mouth from the contrast dye. A feeling of warmth. A headache from the nitroglycerin. Follow these instructions at home: Take over-the-counter and prescription medicines only as told by your health care provider. If you are told, drink enough fluid to keep your urine pale yellow. This will help to flush the contrast dye out of your body. Most people can return to their normal activities right after the procedure. Ask your health care provider what activities are safe for you. It is up to you to get the results of your procedure. Ask your health care provider, or the department that is doing the procedure, when your results will be ready. Keep all follow-up visits as told by your health care provider. This is important. Contact a health care provider if: You have any symptoms of allergy to the contrast dye. These include: Shortness of breath. Rash or hives. A racing heartbeat. Summary A cardiac CT angiogram is a procedure to look at the heart and the area around the  heart. It may be done to help find the cause of chest pains or other symptoms of heart disease. During this procedure, a large X-ray machine, called a CT scanner, takes detailed pictures of the heart and the surrounding area after a contrast dye has been injected into blood vessels in the area. Ask your health care provider about changing or stopping your regular medicines before the procedure. This is especially important if you are taking diabetes medicines, blood thinners, or medicines to treat erectile dysfunction. If you are told, drink enough  fluid to keep your urine pale yellow. This will help to flush the contrast dye out of your body. This information is not intended to replace advice given to you by your health care provider. Make sure you discuss any questions you have with your health care provider. Document Revised: 09/04/2018 Document Reviewed: 09/04/2018 Elsevier Patient Education  2022 [employer]   [place],Jada Bradford,acting as a Neurosurgeonscribe for Chilton Siiffany [place]valon, MD.,have documented all relevant documentation on the behalf of Chilton Siiffany [place], MD,as directed by  Chilton Siiffany [place], MD while in the presence of Chilton Siiffany [place], MD.  I, Cire Clute C. Duke Salviaandolph, MD have reviewed all documentation for this visit.  The documentation of the exam, diagnosis, procedures, and orders on 10/01/2020 are all accurate and complete.   Signed, Chilton Siiffany [place], MD  10/01/2020 12:42 PM    [facility]

## 2020-10-05 LAB — BASIC METABOLIC PANEL
BUN/Creatinine Ratio: 22 (ref 12–28)
BUN: 19 mg/dL (ref 8–27)
CO2: 25 mmol/L (ref 20–29)
Calcium: 8.8 mg/dL (ref 8.7–10.3)
Chloride: 107 mmol/L — ABNORMAL HIGH (ref 96–106)
Creatinine, Ser: 0.88 mg/dL (ref 0.57–1.00)
Glucose: 94 mg/dL (ref 65–99)
Potassium: 4.1 mmol/L (ref 3.5–5.2)
Sodium: 145 mmol/L — ABNORMAL HIGH (ref 134–144)
eGFR: 71 mL/min/{1.73_m2} (ref >59)

## 2020-10-06 ENCOUNTER — Telehealth (HOSPITAL_COMMUNITY): Payer: Self-pay | Admitting: *Deleted

## 2020-10-06 NOTE — Telephone Encounter (Signed)
Reaching out to patient to offer assistance regarding upcoming cardiac imaging study; pt verbalizes understanding of appt date/time, parking situation and where to check in, pre-test NPO status and medications ordered, and verified current allergies; name and call back number provided for further questions should they arise ° °Dwan Fennel RN Navigator Cardiac Imaging °[facility] °[phone] office °[phone] cell  ° °Patient to take 100mg metoprolol tartrate two hours prior to cardiac CT scan. °

## 2020-10-08 ENCOUNTER — Ambulatory Visit (HOSPITAL_COMMUNITY)
Admission: RE | Admit: 2020-10-08 | Discharge: 2020-10-08 | Disposition: A | Discharge status: Home / Self Care | Payer: Medicare Other | Source: Ambulatory Visit | Attending: Cardiovascular Disease | Admitting: Cardiovascular Disease

## 2020-10-08 ENCOUNTER — Encounter: Payer: Medicare Other | Admitting: *Deleted

## 2020-10-08 ENCOUNTER — Other Ambulatory Visit: Payer: Self-pay

## 2020-10-08 DIAGNOSIS — R072 Precordial pain: Secondary | ICD-10-CM | POA: Diagnosis present

## 2020-10-08 DIAGNOSIS — R030 Elevated blood-pressure reading, without diagnosis of hypertension: Secondary | ICD-10-CM | POA: Insufficient documentation

## 2020-10-08 DIAGNOSIS — Z006 Encounter for examination for normal comparison and control in clinical research program: Secondary | ICD-10-CM

## 2020-10-08 DIAGNOSIS — R55 Syncope and collapse: Secondary | ICD-10-CM | POA: Diagnosis present

## 2020-10-08 MED ORDER — SODIUM CHLORIDE 0.9 % IV SOLN: INTRAVENOUS | Status: AC

## 2020-10-08 MED ORDER — NITROGLYCERIN 0.4 MG SL SUBL
SUBLINGUAL_TABLET | SUBLINGUAL | Status: AC
  Filled 2020-10-08: qty 2

## 2020-10-08 MED ORDER — IOHEXOL 350 MG/ML SOLN
100.0000 mL | Freq: Once | INTRAVENOUS | Status: AC | PRN
  Administered 2020-10-08: 100 mL via INTRAVENOUS

## 2020-10-08 MED ORDER — NITROGLYCERIN 0.4 MG SL SUBL
0.8000 mg | SUBLINGUAL_TABLET | Freq: Once | SUBLINGUAL | Status: AC
  Administered 2020-10-08: 0.8 mg via SUBLINGUAL

## 2020-10-08 NOTE — Research (Addendum)
IDENTIFY Informed Consent                  Subject Name:   Angela Burnett   Subject met inclusion and exclusion criteria.  The informed consent form, study requirements and expectations were reviewed with the subject and questions and concerns were addressed prior to the signing of the consent form.  The subject verbalized understanding of the trial requirements.  The subject agreed to participate in the IDENTIFY trial and signed the informed consent at 13:50 p.m. on 10-08-2020.  The informed consent was obtained prior to performance of any protocol-specific procedures for the subject.  A copy of the signed informed consent was given to the subject and a copy was placed in the subject's medical record.   [place] Montario Zilka, Research Assistant 10/08/2020  13:50

## 2020-10-08 NOTE — Progress Notes (Signed)
Called Dr. Anne Fu reagrading BP 93/64.  Orders received for IV bolus.  Instructed to give the usual 2 Nitro's for CT heart

## 2021-07-13 ENCOUNTER — Other Ambulatory Visit: Payer: Self-pay | Admitting: Otolaryngology

## 2021-07-13 DIAGNOSIS — H903 Sensorineural hearing loss, bilateral: Secondary | ICD-10-CM

## 2021-07-15 ENCOUNTER — Ambulatory Visit
Admission: RE | Admit: 2021-07-15 | Discharge: 2021-07-15 | Disposition: A | Discharge status: Home / Self Care | Payer: Medicare Other | Source: Ambulatory Visit | Attending: Otolaryngology | Admitting: Otolaryngology

## 2021-07-15 DIAGNOSIS — H903 Sensorineural hearing loss, bilateral: Secondary | ICD-10-CM

## 2021-07-15 MED ORDER — GADOBENATE DIMEGLUMINE 529 MG/ML IV SOLN
12.0000 mL | Freq: Once | INTRAVENOUS | Status: AC | PRN
  Administered 2021-07-15: 12 mL via INTRAVENOUS

## 2022-01-10 LAB — EXTERNAL GENERIC LAB PROCEDURE: COLOGUARD: NEGATIVE

## 2022-11-15 ENCOUNTER — Ambulatory Visit (INDEPENDENT_AMBULATORY_CARE_PROVIDER_SITE_OTHER): Payer: Medicare Other | Admitting: Audiology

## 2022-11-15 ENCOUNTER — Encounter (INDEPENDENT_AMBULATORY_CARE_PROVIDER_SITE_OTHER): Payer: Self-pay

## 2022-11-15 ENCOUNTER — Ambulatory Visit (INDEPENDENT_AMBULATORY_CARE_PROVIDER_SITE_OTHER): Payer: Medicare Other | Admitting: Otolaryngology

## 2022-11-15 VITALS — Ht 63.0 in | Wt 132.0 lb

## 2022-11-15 DIAGNOSIS — H903 Sensorineural hearing loss, bilateral: Secondary | ICD-10-CM

## 2022-11-15 DIAGNOSIS — H9202 Otalgia, left ear: Secondary | ICD-10-CM | POA: Diagnosis not present

## 2022-11-15 NOTE — Progress Notes (Signed)
  [address], [address] [place], [place] [address] [phone]  Audiological Evaluation    Name: Angela Burnett     DOB:   [birth year]      MRN:   [id number]                                                                                     Service Date: 11/15/2022        Patient was referred today for a hearing evaluation by Dr. Karle Barr.  Symptoms Yes Details  Hearing loss  [x]  Patient reported perceiving hearing loss where the left ear is worse than the right ear.  Tinnitus  [x]  Patient reported intermittent tinnitus in her left ear.  Previous ear surgeries  []  Patient denied any previous ear surgeries.  Amplification  []  Patient denied the use of hearing aids.    Tympanogram: Right ear: Normal external ear canal volume with normal middle ear pressure and tympanic membrane compliance (Type A). Left ear: Could not obtain seal; middle ear status is unable to be determined at this time.    Hearing Evaluation: The audiogram was completed using conventional audiometric techniques under headphones with good reliability.   The hearing test results indicate: Right ear: Normal hearing sensitivity from [phone] sloping to severe sensorineural hearing loss from 4000-8000 Hz. Left ear: Normal hearing sensitivity from [phone] Hz sloping to profound sensorineural hearing loss from 2000-8000 Hz.  Speech Audiometry: Right ear- Speech Reception Threshold (SRT) was obtained at 20 dBHL. Left ear- Speech Reception Threshold (SRT) was obtained at 25 dBHL masked.   Word Recognition Score Tested using NU-6 (MLV) Right ear: 96% was obtained at a presentation level of 60 dBHL which is deemed as excellent understanding. Left ear: 96% was obtained at a presentation level of 65 dBHL with contralateral masking which is deemed as excellent understanding.    Impression: There is not a significant difference in word recognition scores between the ears.    Recommendations: Repeat  audiogram when changes are perceived or per MD. Patient is a candidate for hearing amplification, consider a hearing aid evaluation. Consider various tinnitus strategies, including the use of a noise generator, hearing aids, or tinnitus retraining therapy.   Conley Rolls Caci Orren, AUD, CCC-A 11/15/22

## 2022-11-15 NOTE — Addendum Note (Signed)
Addended bySuszanne Conners, Reade Trefz on: 11/15/2022 10:04 AM   Modules accepted: Orders

## 2022-11-15 NOTE — Progress Notes (Signed)
Patient ID: Angela Burnett, female   DOB: 04/15/1950, 72 y.o.   MRN: [id number]  Follow-up: Hearing loss, left ear pain  HPI: The patient is a 72 year old female who returns today for evaluation of her intermittent left ear pain and hearing loss.  The patient was hit on the left side of her head by a baseball when she was 72 years old.  Since then, she has noted intermittent left ear pain.  In addition, she has also noted progressive left ear hearing loss.  She underwent an MRI scan in 06/2021.  The MRI was negative for any retrocochlear lesion.  She returns today complaining of progressive hearing loss.  Currently she has no significant otalgia, otorrhea or vertigo.  She has no previous ENT surgery.    Exam: General: Communicates without difficulty, well nourished, no acute distress. Head: Normocephalic, no evidence injury, no tenderness, facial buttresses intact without stepoff. Face/sinus: No tenderness to palpation and percussion. Facial movement is normal and symmetric. Eyes: PERRL, EOMI. No scleral icterus, conjunctivae clear. Neuro: CN II exam reveals vision grossly intact.  No nystagmus at any point of gaze. Ears: Auricles well formed without lesions.  Ear canals are intact without mass or lesion.  No erythema or edema is appreciated.  The TMs are intact without fluid. Nose: External evaluation reveals normal support and skin without lesions.  Dorsum is intact.  Anterior rhinoscopy reveals pink mucosa over anterior aspect of inferior turbinates and intact septum.  No purulence noted. Oral:  Oral cavity and oropharynx are intact, symmetric, without erythema or edema.  Mucosa is moist without lesions. Neck: Full range of motion without pain.  There is no significant lymphadenopathy.  No masses palpable.  Thyroid bed within normal limits to palpation.  Parotid glands and submandibular glands equal bilaterally without mass.  Trachea is midline. Neuro:  CN 2-12 grossly intact. Gait normal.   Her  hearing test shows stable bilateral high-frequency sensorineural hearing loss, slightly worse on the left side.  Assessment: 1.  Stable bilateral high frequency sensorineural hearing loss, worse on the left side  2.  The patient's ear canals, tympanic membranes, and middle ear spaces are all normal. No middle ear effusion or infection is noted.  3.  Her intermittent left ear pain may be neurogenic in etiology.   Plan: 1.  The physical exam findings and the hearing test results are reviewed with the patient. 2.  The patient is reassured that no acute infection is noted today.  3.  She is a candidate for hearing amplification.  The hearing aid options are discussed.  4.  The patient will return for re-evaluation in 1 year, sooner if needed.

## 2022-11-29 ENCOUNTER — Encounter: Payer: Self-pay | Admitting: Audiology

## 2023-10-01 ENCOUNTER — Other Ambulatory Visit (HOSPITAL_BASED_OUTPATIENT_CLINIC_OR_DEPARTMENT_OTHER): Payer: Self-pay

## 2023-10-01 MED ORDER — MORPHINE SULFATE ER 60 MG PO TBCR
60.0000 mg | EXTENDED_RELEASE_TABLET | Freq: Every day | ORAL | 0 refills | Status: AC
  Filled 2023-10-01: qty 8, 8d supply, fill #0
